# Patient Record
Sex: Female | Born: 1955 | Race: White | Hispanic: No | Marital: Married | Smoking: Never smoker
Health system: Southern US, Community
[De-identification: ages and names within clinical notes are randomized; demographics above are authoritative.]

## PROBLEM LIST (undated history)

## (undated) DIAGNOSIS — K5792 Diverticulitis of intestine, part unspecified, without perforation or abscess without bleeding: Secondary | ICD-10-CM

## (undated) DIAGNOSIS — K589 Irritable bowel syndrome without diarrhea: Secondary | ICD-10-CM

## (undated) DIAGNOSIS — N950 Postmenopausal bleeding: Secondary | ICD-10-CM

## (undated) DIAGNOSIS — K5790 Diverticulosis of intestine, part unspecified, without perforation or abscess without bleeding: Secondary | ICD-10-CM

## (undated) DIAGNOSIS — Z8719 Personal history of other diseases of the digestive system: Secondary | ICD-10-CM

## (undated) DIAGNOSIS — M5030 Other cervical disc degeneration, unspecified cervical region: Secondary | ICD-10-CM

## (undated) DIAGNOSIS — E042 Nontoxic multinodular goiter: Secondary | ICD-10-CM

## (undated) DIAGNOSIS — J189 Pneumonia, unspecified organism: Secondary | ICD-10-CM

## (undated) DIAGNOSIS — K219 Gastro-esophageal reflux disease without esophagitis: Secondary | ICD-10-CM

## (undated) DIAGNOSIS — K602 Anal fissure, unspecified: Secondary | ICD-10-CM

## (undated) DIAGNOSIS — C44101 Unspecified malignant neoplasm of skin of unspecified eyelid, including canthus: Secondary | ICD-10-CM

## (undated) HISTORY — DX: Pneumonia, unspecified organism: J18.9

## (undated) HISTORY — DX: Irritable bowel syndrome, unspecified: K58.9

## (undated) HISTORY — DX: Unspecified malignant neoplasm of skin of unspecified eyelid, including canthus: C44.101

## (undated) HISTORY — DX: Diverticulosis of intestine, part unspecified, without perforation or abscess without bleeding: K57.90

## (undated) HISTORY — DX: Diverticulitis of intestine, part unspecified, without perforation or abscess without bleeding: K57.92

## (undated) HISTORY — PX: DILATION AND CURETTAGE OF UTERUS: SHX78

## (undated) HISTORY — PX: OTHER SURGICAL HISTORY: SHX169

## (undated) HISTORY — PX: BREAST ENHANCEMENT SURGERY: SHX7

## (undated) HISTORY — PX: WISDOM TOOTH EXTRACTION: SHX21

## (undated) HISTORY — DX: Anal fissure, unspecified: K60.2

## (undated) HISTORY — PX: TUBAL LIGATION: SHX77

## (undated) NOTE — Progress Notes (Signed)
 Formatting of this note is different from the original. Subjective:    Patient ID: Jade [name] is a 6 y.o. female.  Chief complaint:  No chief complaint on file.   HPI This woman has been widowed for several years.  Over the last year she has become engaged and resume sexual activity.  She has had a couple of urinary tract infections.  She had a distant bilateral bladder breast implants which have been ruptured for several years.  She has been planning on having these replaced but it keeps putting it off.  She has had some lower abdominal pain.  She has had a couple of urinary tract infections.  She is due a colonoscopy.  She has had a Pap smear elsewhere  Social History:   Social History[1]   Family History:   Family History[2]   Current Medications[3] See present illness.  Review of Systems  Constitutional:  Positive for fatigue.  Cardiovascular:  Positive for chest pain.  All other systems reviewed and are negative.    Objective:   BP 120/80 (BP Location: Left arm, Patient Position: Sitting)   Pulse 62   Ht 1.727 m (5' 8)   Wt 66 kg (145 lb 6.4 oz)   SpO2 98%   BMI 22.11 kg/m  Physical Exam  GENGENERAL:     General Appearance: well-developed, well nourished female.  SKIN:     General: warm and dry without lesions   HEENT:    Nose: normal pink mucosa, septum is clear. Ears: external auditory canals clear, tympanic membrane(s) intact, no discharge, no skin lesions, no masses, hearing is within normal limits.       Mouth: no oral pathological findings. Throat: clear. Lips: symmetrical.    EYES:    Conjunctiva: normal.     Eyelids: normal.     Fundi: flat discs.     Pupils: equal, round, reactive to light with extraocular movements intact.   NECK:    General: supple, no jugular venous distention, no hepatojugular reflux, no thyromegaly, no goiter   LYMPH NODES:    Axillary: no adenopathy.     Cervical: no adenopathy.     Inguinal: no adenopathy.    BREASTS:    Bilateral ruptured implants. LUNGS:    Respiratory distress: none.     Auscultation: clear.     Percussion: clear.   HEART:    PMI: normal.     Rate: regular.     Edema: no dependent edema.     Gallop: none.     Rub: none.     Carotid bruit: none.     Abdominal aortic bruit: none.     Femoral arteries: no bruit, equal amplitude.     Pulses: peripheral, dorsalis pedis, posterior pulses are 2+.     Rhythm: regular.     Murmurs: none.   ABDOMEN:    General: soft and nontender.     Organomegaly: none.     Masses: none.     Hernia: none.   MUSCULOSKELETAL:     Gait: normal.     Digits/nails: no cyanosis, no clubbing.     Head/neck: normal range of motion.             Spine: normal ROM.     Ribs: nontender.     Pelvis: normal range of motion.     Upper extremities bilaterally: well perfused.     Lower extremities bilaterally: well perfused.  NEUROLOGICAL:    Cranial nerves: II-XII are intact.  Sensory: no gross deficits.     Reflexes: deep tendons reflexes are 2+ and symmetrical, no pathological reflexes.   PSYCHIATRIC:     Judgement and insight: WNL.     Oriented: X three.     Memory: within normal limits.     Depression: none.     Anxiety: none.     Agitation: none.   Electronically signed by: ONEIL KANDICE SPLINTER, MD  This visit was generated using voice recognition software, certain errors may have occurred in transcription.     Assessment and Plan:   1. Encounter for routine adult health examination with abnormal findings     2. Lower abdominal pain  CT Abdomen Pelvis W Wo Contrast   3. Postmenopausal  DXA bone density 1 + sites   4. Colon cancer screening  Colonoscopy Referral   5. Pelvic pain  Ultrasound non-ob transvaginal   6. Chest discomfort  X-ray chest 2 views   7. Primary hypertension     8. 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 Dense breast     10. Rupture of implant of left breast, subsequent encounter      Her  blood pressure control is good.  Her chemistries are reviewed and found to be acceptable.  She is due a bone density and a chest x-ray.  She has had an MRI of her breasts earlier this year.  With her abdominal symptoms I would like to do a CT of the abdomen and pelvis with and without oral and IV contrast.  I would like to do a vaginal pelvic ultrasound.  She is leaving the state and will get this done on the [facility].  Will have a video visit afterwards.  I have congratulated her on getting engaged.  She is on hormone replacement therapy including Prempro and testosterone.      I approve my staff updating/discontinuing the medications in my medical record.   Electronically signed by: ONEIL KANDICE SPLINTER, MD  This visit was generated using voice recognition software, certain errors may have occurred in transcription.      [1]  Social History Socioeconomic History   Marital status: Widowed  Tobacco Use   Smoking status: Never    Passive exposure: Never   Smokeless tobacco: Never  Vaping Use   Vaping status: Never Used  Substance and Sexual Activity   Alcohol use: Yes    Alcohol/week: 1.0 standard drink of alcohol    Types: 1 Standard drinks or equivalent per week    Comment: not often   Drug use: Never   Sexual activity: Not Currently  [2]  Family History Problem Relation Name Age of Onset   Stroke Mother     Lung cancer Father    [3]  Current Outpatient Medications  Medication Sig Dispense Refill   estrogen, conjugated,-medroxyprogesterone (Prempro) 0.625-2.5 MG tablet Take 1 tablet by mouth daily 28 tablet 3   tretinoin (Retin-A) 0.05 % cream Apply a small amount to skin every night at bedtime     UNABLE TO FIND Testosterone 8mg /ml Cream Apply 0.5ML (TWO CLICKS) to think skin daily 45 g 1   valACYclovir (Valtrex) 1000 MG tablet Take 2 tablet by mouth twice a day at first sign of symptoms. For fever blisters.     valsartan (Diovan) 160 MG tablet Take 1 tablet (160 mg total)  by mouth daily 90 tablet 1   ibuprofen (ADVIL) 200 MG tablet Take 200 mg by mouth     nitrofurantoin (  Macrodantin) 100 MG capsule      Current Facility-Administered Medications  Medication Dose Route Frequency Provider Last Rate Last Admin   lidocaine  (Xylocaine ) 1 % injection 0.9-4.2 mL  0.9-4.2 mL Other PRN Lorie Anes, MD       Electronically signed by Lorie Anes, MD at 06/23/2024  9:08 AM CDT

## (undated) NOTE — Addendum Note (Signed)
 Addended by: TANKSLEY, JORDAN on: 06/28/2024 02:32 PM   Modules accepted: Orders   Electronically signed by Tanks[place], [place], CCMA at 06/28/2024  2:32 PM CDT

---

## 2006-01-29 ENCOUNTER — Other Ambulatory Visit: Admission: RE | Admit: 2006-01-29 | Discharge: 2006-01-29 | Payer: Self-pay | Admitting: Obstetrics and Gynecology

## 2007-10-21 HISTORY — PX: OTHER SURGICAL HISTORY: SHX169

## 2008-07-17 ENCOUNTER — Inpatient Hospital Stay (HOSPITAL_COMMUNITY): Admission: AD | Admit: 2008-07-17 | Discharge: 2008-07-17 | Payer: Self-pay | Admitting: Obstetrics & Gynecology

## 2008-10-20 HISTORY — PX: ORBITAL RIM RECONSTRUCTION: SHX427

## 2009-10-20 HISTORY — PX: COLONOSCOPY: SHX174

## 2010-01-22 ENCOUNTER — Ambulatory Visit (HOSPITAL_COMMUNITY): Admission: RE | Admit: 2010-01-22 | Discharge: 2010-01-22 | Payer: Self-pay | Admitting: Obstetrics and Gynecology

## 2010-08-06 ENCOUNTER — Emergency Department (HOSPITAL_COMMUNITY): Admission: EM | Admit: 2010-08-06 | Discharge: 2010-08-06 | Payer: Self-pay | Admitting: Emergency Medicine

## 2010-08-07 ENCOUNTER — Emergency Department (HOSPITAL_COMMUNITY): Admission: EM | Admit: 2010-08-07 | Discharge: 2010-08-07 | Payer: Self-pay | Admitting: Emergency Medicine

## 2010-08-20 DEATH — deceased

## 2010-09-05 ENCOUNTER — Encounter: Payer: Self-pay | Admitting: Internal Medicine

## 2010-09-10 ENCOUNTER — Ambulatory Visit: Payer: Self-pay | Admitting: Internal Medicine

## 2010-09-10 DIAGNOSIS — R0789 Other chest pain: Secondary | ICD-10-CM

## 2010-09-10 DIAGNOSIS — R079 Chest pain, unspecified: Secondary | ICD-10-CM | POA: Insufficient documentation

## 2010-09-16 ENCOUNTER — Ambulatory Visit (HOSPITAL_COMMUNITY)
Admission: RE | Admit: 2010-09-16 | Discharge: 2010-09-16 | Payer: Self-pay | Source: Home / Self Care | Admitting: Internal Medicine

## 2010-09-20 ENCOUNTER — Telehealth: Payer: Self-pay | Admitting: Internal Medicine

## 2010-09-24 ENCOUNTER — Encounter: Payer: Self-pay | Admitting: Gastroenterology

## 2010-09-27 ENCOUNTER — Telehealth: Payer: Self-pay | Admitting: Internal Medicine

## 2010-10-16 ENCOUNTER — Ambulatory Visit: Payer: Self-pay | Admitting: Internal Medicine

## 2010-11-05 ENCOUNTER — Encounter: Payer: Self-pay | Admitting: Gastroenterology

## 2010-11-05 ENCOUNTER — Ambulatory Visit: Admission: RE | Admit: 2010-11-05 | Payer: Self-pay | Source: Home / Self Care | Admitting: Gastroenterology

## 2010-11-14 ENCOUNTER — Other Ambulatory Visit: Payer: Self-pay | Admitting: Gastroenterology

## 2010-11-19 NOTE — Assessment & Plan Note (Signed)
Summary: np6/appt at 12:30-mb   Referring Provider:  Micah Flesher   History of Present Illness: Jade [name] is a 55 y/o postmenopausal woman with no significant PMHx excpt for h/o uterine fibroids and ovarian cysts referred by Dr. Micah Flesher for further evaluation of jaw and throat pain.  Denies any h/o known cardiac problems. No h/o HTN or HL. 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 + strong familh h/o CAD/stroke  In early October was sitting on couch and had sharp severe CP. Last 1-2 mins. Recuured about a week later while driving the car. On October 18 was sitting at computer had jaw pain in neck into lower jaw. No CP. Became anxious and diaphorectic. Went to ER. Had ECG but left before enzymes drawn. Went back the next day to ge enzymes and they were normal.   Since that time has had constant pressure in chest going to back. Had another episode of jaw pain last week which was transient. Very concerned about it. Has given up all her walking. No relief with iburpofen, ASA or tylenol.  No change with eating. No reflux symptoms or esophageal. No blood in stool. No long travel. Had labs yesterday with normal electrolytes and amylase.   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   Vital Signs:  Patient profile:   55 year old female Height:      68 inches Weight:      145 pounds BMI:     22.13 Pulse rate:   62 / minute Resp:     16 per minute BP sitting:   110 / 66  (right arm)  Vitals Entered By: Marrion Coy, CNA (11-Sep-2010 12:42 PM)  Physical Exam  General:  Well appearing. no resp difficulty HEENT: normal. minimal asymmetry of L lower lip Neck: supple. no JVD. Carotids 2+ bilat; no bruits. No lymphadenopathy or thryomegaly appreciated. Cor: PMI nondisplaced. Regular rate & rhythm. No rubs, gallops, murmur. Lungs: clear Abdomen: soft, minimally tender in epigastrum, nondistended. No hepatosplenomegaly. No bruits or masses. Good bowel sounds. Extremities: no cyanosis, clubbing, rash, edema Neuro: alert & orientedx3, cranial nerves grossly intact. moves all 4 extremities w/o difficulty. affect pleasant    Impression & Recommendations:  Problem # 1:  CHEST TIGHTNESS-PRESSURE-OTHER (ICD-786.59) Typical and atypical features. I suspect this is "silent" reflux but given her family history of CV disease will need further risk stratification. She is concerned about false negative rate of routine stress testing so we had long discussion re; cath vs cardiac CT. Not a candiate for Promise trial due to lack of CV RFs. She has opted to pay for cardiac CT out of  pocket. Will try to arrange for Friday. Start Protonix 40.   Patient Instructions: 1)  We are going to try to get your cardiac CT scheduled for Friday 11/25. We will call you today with the details of this. 2)  Start Protonix 40mg  once daily. 3)  Your physician recommends that you schedule a follow-up appointment in: 1 month.  Appended Document: np6    Clinical Lists Changes  Medications: Rx of PROTONIX 40 MG TBEC  (PANTOPRAZOLE SODIUM) take one tab by mouth once daily;  #30 x 6;  Signed;  Entered by: Sherri Rad, RN, BSN;  Authorized by: Dolores Patty, MD, [facility];  Method used: [facility]  [place] [address] [place], [place], [place]  [address], Ph: [phone] or [phone], Fax: [phone] Orders: Added new Referral order of Cardiac CTA (Cardiac CTA) - Signed    Prescriptions: PROTONIX 40 MG TBEC (PANTOPRAZOLE SODIUM) take one tab by mouth once daily  #30 x 6   Entered by:   Sherri Rad, RN, BSN   Authorized by:   Dolores Patty, MD, [facility]   Signed by:   Sherri Rad, RN, BSN on 09/10/2010   Method used:   [facility]ically to        [facility]  [place] [address] (retail)       [address]       [place], [place]  [address]       Ph: [phone] or [phone]       Fax: [phone]   RxID:   [id number]

## 2010-11-19 NOTE — Letter (Signed)
Summary: New Patient letter  [facility]  [address] [place], [place] [address]   Phone: [phone]  Fax: [phone]       09/24/2010 MRN: [id number]  [facility] [address] [place], [place]  [address]  Dear Jade [name],  Welcome to the Gastroenterology Division at [facility]    You are scheduled to see Dr.  Jarold Motto on 11-05-2010 at 1:30pm on the 3rd floor at [facility], [address]  We ask that you try to arrive at our office 15 minutes prior to your appointment time to allow for check-in.  We would like you to complete the enclosed self-administered evaluation form prior to your visit and bring it with you on the day of your appointment.  We will review it with you.  Also, please bring a complete list of all your medications or, if you prefer, bring the medication bottles and we will list them.  Please bring your insurance card so that we may make a copy of it.  If your insurance requires a referral to see a specialist, please bring your referral form from your primary care physician.  Co-payments are due at the time of your visit and may be paid by cash, check or credit card.     Your office visit will consist of a consult with your physician (includes a physical exam), any laboratory testing he/she may order, scheduling of any necessary diagnostic testing (e.g. x-ray, ultrasound, CT-scan), and scheduling of a procedure (e.g. Endoscopy, Colonoscopy) if required.  Please allow enough time on your schedule to allow for any/all of these possibilities.    If you cannot keep your appointment, please call [phone] to cancel or reschedule prior to your appointment date.  This allows [place] the opportunity to schedule an appointment for another patient in need of care.  If you do not cancel or reschedule by 5 p.m. the business day prior to your appointment date, you will be charged a $50.00 late cancellation/no-show fee.    Thank you for choosing  [facility] for your medical needs.  We appreciate the opportunity to care for you.  Please visit [place] at our website  to learn more about our practice.                     Sincerely,                                                             The Gastroenterology Division

## 2010-11-19 NOTE — Progress Notes (Signed)
Summary: question on ct procedure  Phone Note Call from Patient Call back at Home Phone [phone] Call back at cell- [phone]   Caller: Patient Reason for Call: Talk to Nurse Summary of Call: pt has question re ct procedure she had done. pt states has a rash on her arm/ pulse rate is up/pt has question on meds.  Initial call taken by: Roe Coombs,  September 20, 2010 12:24 PM  Follow-up for Phone Call        spoke w/pt she states she developed a rash on her arm on Wed she feels it could be from the betadine that put on her arm for IV, she states she has been putting lotion on it and it hasn't gotten better but it hasn't gotten worse either, advised that is likely recommended she take benadryl and use some corizone cream, she is agreeable.  She states she feels the Protonix was causing her heart to race and at times she could feel it pounding, she states she checked it at [employer] today and pulse was 70 which is high for her, she states she doesn't want to take med so she has stopped, she states it hasn't helped her symptoms and she would like referral to GI will see who Dr Gala Romney recommends and place referral Meredith Staggers, RN  September 20, 2010 4:41 PM   Additional Follow-up for Phone Call Additional follow up Details #1::        next avaialble. Dolores Patty, MD, [facility]  September 20, 2010 5:42 PM      Appended Document: question on ct procedure order for GI referral placed Meredith Staggers, RN  September 24, 2010 10:16 AM    Clinical Lists Changes  Orders: Added new Referral order of Gastroenterology Referral (GI) - Signed

## 2010-11-19 NOTE — Progress Notes (Signed)
Summary: heart beatr/heart rate  Phone Note Call from Patient Call back at Home Phone [phone]   Caller: Patient Reason for Call: Talk to Nurse Summary of Call: Pt states she can hear her heart beat in her ears. Heart rate 86 pt states she is normally in the 50s. Initial call taken by: Roe Coombs,  September 27, 2010 11:07 AM  Follow-up for Phone Call        I called and spoke with the pt. She states she can hear her heart beating in her ears since she had her CT scan done. She states her HR is always in the 50's, but today she checked it and it was 86 bpm. She is concerned that there may be some relation to this and the CT scan or the access they had to get on her. I explained to the pt I would need to review with Dr. Gala Romney. She is agreeable. Follow-up by: Sherri Rad, RN, BSN,  September 27, 2010 12:01 PM  Additional Follow-up for Phone Call Additional follow up Details #1::        I spoke with Dr. Gala Romney. He states there should be no relationship between her HR and the pounding in her ears with the CT scan or access. He did state she could come for an EKG if she were concerned about this. I called the pt and explained this to her. She states she will just observe her symptoms for now and she will call us back should she feel worse. Additional Follow-up by: Sherri Rad, RN, BSN,  September 27, 2010 12:10 PM

## 2010-11-20 ENCOUNTER — Ambulatory Visit
Admission: RE | Admit: 2010-11-20 | Discharge: 2010-11-20 | Disposition: A | Discharge status: Home / Self Care | Payer: 59 | Source: Ambulatory Visit | Attending: Gastroenterology | Admitting: Gastroenterology

## 2010-11-21 NOTE — Assessment & Plan Note (Signed)
Summary: GERD & REFLUX/YF  History of Present Illness:   patient not seen because she apparently is a patient of Dr. Ewing Schlein.           Allergies: No Known Drug Allergies

## 2010-11-27 ENCOUNTER — Other Ambulatory Visit: Payer: Self-pay | Admitting: Otolaryngology

## 2010-11-27 DIAGNOSIS — M542 Cervicalgia: Secondary | ICD-10-CM

## 2010-11-28 ENCOUNTER — Ambulatory Visit
Admission: RE | Admit: 2010-11-28 | Discharge: 2010-11-28 | Disposition: A | Discharge status: Home / Self Care | Payer: 59 | Source: Ambulatory Visit | Attending: Otolaryngology | Admitting: Otolaryngology

## 2010-11-28 ENCOUNTER — Other Ambulatory Visit: Payer: 59

## 2010-11-28 DIAGNOSIS — M542 Cervicalgia: Secondary | ICD-10-CM

## 2011-01-01 LAB — CBC
HCT: 40.3 % (ref 36.0–46.0)
Hemoglobin: 13.2 g/dL (ref 12.0–15.0)
MCH: 29.8 pg (ref 26.0–34.0)
RBC: 4.43 MIL/uL (ref 3.87–5.11)

## 2011-01-01 LAB — POCT CARDIAC MARKERS
CKMB, poc: <1 ng/mL — ABNORMAL LOW (ref 1.0–8.0)
Troponin i, poc: <0.05 ng/mL (ref 0.00–0.09)

## 2011-01-01 LAB — POCT I-STAT, CHEM 8
Chloride: 104 mEq/L (ref 96–112)
Creatinine, Ser: 1.3 mg/dL — ABNORMAL HIGH (ref 0.4–1.2)
Glucose, Bld: 106 mg/dL — ABNORMAL HIGH (ref 70–99)
Hemoglobin: 14.3 g/dL (ref 12.0–15.0)
Potassium: 3.7 mEq/L (ref 3.5–5.1)

## 2011-01-01 LAB — DIFFERENTIAL
Eosinophils Absolute: 0.1 10*3/uL (ref 0.0–0.7)
Lymphs Abs: 1.5 10*3/uL (ref 0.7–4.0)
Monocytes Absolute: 0.3 10*3/uL (ref 0.1–1.0)
Monocytes Relative: 6 % (ref 3–12)
Neutrophils Relative %: 64 % (ref 43–77)

## 2011-04-22 ENCOUNTER — Encounter: Payer: Self-pay | Admitting: Internal Medicine

## 2011-07-21 LAB — POCT PREGNANCY, URINE: Preg Test, Ur: NEGATIVE

## 2011-07-21 LAB — COMPREHENSIVE METABOLIC PANEL
AST: 16
Albumin: 3.8
Alkaline Phosphatase: 76
Chloride: 104
Creatinine, Ser: 0.66
GFR calc Af Amer: >60
Potassium: 4
Total Bilirubin: 0.9

## 2011-07-21 LAB — URINALYSIS, ROUTINE W REFLEX MICROSCOPIC
Nitrite: NEGATIVE
Specific Gravity, Urine: 1.02
Urobilinogen, UA: 0.2

## 2011-07-21 LAB — WET PREP, GENITAL: Yeast Wet Prep HPF POC: NONE SEEN

## 2011-07-21 LAB — CBC
Platelets: 172
WBC: 7.3

## 2014-06-08 ENCOUNTER — Encounter (HOSPITAL_BASED_OUTPATIENT_CLINIC_OR_DEPARTMENT_OTHER): Payer: Self-pay | Admitting: *Deleted

## 2014-06-09 ENCOUNTER — Encounter (HOSPITAL_BASED_OUTPATIENT_CLINIC_OR_DEPARTMENT_OTHER): Payer: Self-pay | Admitting: *Deleted

## 2014-06-09 DIAGNOSIS — N950 Postmenopausal bleeding: Secondary | ICD-10-CM | POA: Diagnosis present

## 2014-06-09 DIAGNOSIS — K219 Gastro-esophageal reflux disease without esophagitis: Secondary | ICD-10-CM | POA: Diagnosis not present

## 2014-06-09 DIAGNOSIS — E041 Nontoxic single thyroid nodule: Secondary | ICD-10-CM | POA: Diagnosis not present

## 2014-06-09 DIAGNOSIS — M5030 Other cervical disc degeneration, unspecified cervical region: Secondary | ICD-10-CM | POA: Diagnosis not present

## 2014-06-09 DIAGNOSIS — N840 Polyp of corpus uteri: Secondary | ICD-10-CM | POA: Diagnosis not present

## 2014-06-09 DIAGNOSIS — R9389 Abnormal findings on diagnostic imaging of other specified body structures: Secondary | ICD-10-CM | POA: Diagnosis not present

## 2014-06-09 DIAGNOSIS — D259 Leiomyoma of uterus, unspecified: Secondary | ICD-10-CM | POA: Diagnosis not present

## 2014-06-09 LAB — CBC
HCT: 35.3 % — ABNORMAL LOW (ref 36.0–46.0)
Hemoglobin: 12.2 g/dL (ref 12.0–15.0)
MCH: 31 pg (ref 26.0–34.0)
MCHC: 34.6 g/dL (ref 30.0–36.0)
MCV: 89.8 fL (ref 78.0–100.0)
Platelets: 203 10*3/uL (ref 150–400)
RBC: 3.93 MIL/uL (ref 3.87–5.11)
RDW: 11.9 % (ref 11.5–15.5)
WBC: 4.9 10*3/uL (ref 4.0–10.5)

## 2014-06-09 NOTE — Progress Notes (Signed)
NPO AFTER MN. ARRIVE AT 0615. CBC TO BE DONE TODAY. WILL TAKE DEXILANT AM DOS W/ SIPS OF WATER.

## 2014-06-09 NOTE — H&P (Signed)
Jade [name] is an 58 y.o. female with postmenopausal bleeding on Prempro. Endometrial stripe 7 mm  Pertinent Gynecological History: Menses: post-menopausal Bleeding: post menopausal bleeding Contraception: none DES exposure: denies Blood transfusions: none Sexually transmitted diseases: no past history Previous GYN Procedures: none  Last mammogram: normal Date: 2015 Last pap: normal Date: 2015 OB History: G0, P0  Menstrual History: Menarche age: unknown  No LMP recorded. Patient is postmenopausal.    Past Medical History  Diagnosis Date  . Osteoarthritis   . Endometrial polyp   . Multiple thyroid nodules     BILATERAL PER CT  . DDD (degenerative disc disease), cervical     Past Surgical History  Procedure Laterality Date  . Breast enhancement surgery    . Ovarian cyst surgery      Uterine fibroid surgery  . Orbital rim reconstruction      Eyelid CA  . Tubal ligation      Family History  Problem Relation Age of Onset  . Stroke Mother     Many people on mother's side have had strokes  . Lung cancer Father   . Heart attack Father     Social History:  has no tobacco, alcohol, and drug history on file.  Allergies: Allergies not on file  No prescriptions prior to admission    ROS  There were no vitals taken for this visit. Physical Exam  General alert and oriented Lung CTAB Car RRR Abdomen is soft and nontender Pelvic Above  Assessment/Plan: Postmenopausal bleeding Thickened endometrial stripe D and C, Hysteroscopy Risks reviewed Jade [name] L 06/09/2014, 8:27 AM

## 2014-06-12 ENCOUNTER — Encounter (HOSPITAL_BASED_OUTPATIENT_CLINIC_OR_DEPARTMENT_OTHER): Payer: 59 | Admitting: Anesthesiology

## 2014-06-12 ENCOUNTER — Ambulatory Visit (HOSPITAL_BASED_OUTPATIENT_CLINIC_OR_DEPARTMENT_OTHER)
Admission: RE | Admit: 2014-06-12 | Discharge: 2014-06-12 | Disposition: A | Discharge status: Home / Self Care | Payer: 59 | Source: Ambulatory Visit | Attending: Obstetrics and Gynecology | Admitting: Obstetrics and Gynecology

## 2014-06-12 ENCOUNTER — Ambulatory Visit (HOSPITAL_BASED_OUTPATIENT_CLINIC_OR_DEPARTMENT_OTHER): Payer: 59 | Admitting: Anesthesiology

## 2014-06-12 ENCOUNTER — Encounter (HOSPITAL_BASED_OUTPATIENT_CLINIC_OR_DEPARTMENT_OTHER): Payer: Self-pay | Admitting: *Deleted

## 2014-06-12 ENCOUNTER — Encounter (HOSPITAL_BASED_OUTPATIENT_CLINIC_OR_DEPARTMENT_OTHER)
Admission: RE | Disposition: A | Discharge status: Home / Self Care | Payer: Self-pay | Source: Ambulatory Visit | Attending: Obstetrics and Gynecology

## 2014-06-12 DIAGNOSIS — K219 Gastro-esophageal reflux disease without esophagitis: Secondary | ICD-10-CM | POA: Insufficient documentation

## 2014-06-12 DIAGNOSIS — D259 Leiomyoma of uterus, unspecified: Secondary | ICD-10-CM | POA: Insufficient documentation

## 2014-06-12 DIAGNOSIS — N950 Postmenopausal bleeding: Secondary | ICD-10-CM | POA: Diagnosis not present

## 2014-06-12 DIAGNOSIS — E041 Nontoxic single thyroid nodule: Secondary | ICD-10-CM | POA: Insufficient documentation

## 2014-06-12 DIAGNOSIS — N840 Polyp of corpus uteri: Secondary | ICD-10-CM | POA: Insufficient documentation

## 2014-06-12 DIAGNOSIS — M5030 Other cervical disc degeneration, unspecified cervical region: Secondary | ICD-10-CM | POA: Insufficient documentation

## 2014-06-12 DIAGNOSIS — R9389 Abnormal findings on diagnostic imaging of other specified body structures: Secondary | ICD-10-CM | POA: Insufficient documentation

## 2014-06-12 HISTORY — DX: Other cervical disc degeneration, unspecified cervical region: M50.30

## 2014-06-12 HISTORY — DX: Postmenopausal bleeding: N95.0

## 2014-06-12 HISTORY — DX: Nontoxic multinodular goiter: E04.2

## 2014-06-12 HISTORY — DX: Personal history of other diseases of the digestive system: Z87.19

## 2014-06-12 HISTORY — DX: Gastro-esophageal reflux disease without esophagitis: K21.9

## 2014-06-12 HISTORY — PX: HYSTEROSCOPY WITH D & C: SHX1775

## 2014-06-12 SURGERY — DILATATION AND CURETTAGE /HYSTEROSCOPY
Anesthesia: General | Site: Uterus

## 2014-06-12 MED ORDER — GLYCOPYRROLATE 0.2 MG/ML IJ SOLN
INTRAMUSCULAR | Status: DC | PRN
  Administered 2014-06-12: 0.2 mg via INTRAVENOUS

## 2014-06-12 MED ORDER — LACTATED RINGERS IV SOLN
INTRAVENOUS | Status: DC
Start: 2014-06-12 — End: 2014-06-12
  Administered 2014-06-12: 07:00:00 via INTRAVENOUS
  Filled 2014-06-12: qty 1000

## 2014-06-12 MED ORDER — DEXAMETHASONE SODIUM PHOSPHATE 4 MG/ML IJ SOLN
INTRAMUSCULAR | Status: DC | PRN
  Administered 2014-06-12: 8 mg via INTRAVENOUS

## 2014-06-12 MED ORDER — FENTANYL CITRATE 0.05 MG/ML IJ SOLN
INTRAMUSCULAR | Status: DC | PRN
  Administered 2014-06-12: 100 ug via INTRAVENOUS

## 2014-06-12 MED ORDER — PROPOFOL 10 MG/ML IV BOLUS
INTRAVENOUS | Status: DC | PRN
  Administered 2014-06-12: 150 mg via INTRAVENOUS

## 2014-06-12 MED ORDER — LACTATED RINGERS IV SOLN
INTRAVENOUS | Status: DC
  Filled 2014-06-12: qty 1000

## 2014-06-12 MED ORDER — METOCLOPRAMIDE HCL 5 MG/ML IJ SOLN
10.0000 mg | Freq: Once | INTRAMUSCULAR | Status: DC | PRN
  Filled 2014-06-12: qty 2

## 2014-06-12 MED ORDER — LIDOCAINE HCL (CARDIAC) 20 MG/ML IV SOLN
INTRAVENOUS | Status: DC | PRN
  Administered 2014-06-12: 70 mg via INTRAVENOUS

## 2014-06-12 MED ORDER — FENTANYL CITRATE 0.05 MG/ML IJ SOLN
INTRAMUSCULAR | Status: AC
  Filled 2014-06-12: qty 4

## 2014-06-12 MED ORDER — LIDOCAINE HCL 1 % IJ SOLN
INTRAMUSCULAR | Status: DC | PRN
  Administered 2014-06-12: 10 mL

## 2014-06-12 MED ORDER — MIDAZOLAM HCL 2 MG/2ML IJ SOLN
INTRAMUSCULAR | Status: AC
  Filled 2014-06-12: qty 2

## 2014-06-12 MED ORDER — STERILE WATER FOR IRRIGATION IR SOLN
Status: DC | PRN
  Administered 2014-06-12: 1000 mL

## 2014-06-12 MED ORDER — MIDAZOLAM HCL 5 MG/5ML IJ SOLN
INTRAMUSCULAR | Status: DC | PRN
  Administered 2014-06-12: 2 mg via INTRAVENOUS

## 2014-06-12 MED ORDER — GLYCINE 1.5 % IR SOLN
Status: DC | PRN
  Administered 2014-06-12: 3000 mL

## 2014-06-12 MED ORDER — ONDANSETRON HCL 4 MG/2ML IJ SOLN
INTRAMUSCULAR | Status: DC | PRN
  Administered 2014-06-12: 4 mg via INTRAVENOUS

## 2014-06-12 MED ORDER — CEFAZOLIN SODIUM-DEXTROSE 2-3 GM-% IV SOLR
2.0000 g | INTRAVENOUS | Status: AC
  Administered 2014-06-12: 2 g via INTRAVENOUS
  Filled 2014-06-12: qty 50

## 2014-06-12 MED ORDER — FENTANYL CITRATE 0.05 MG/ML IJ SOLN
25.0000 ug | INTRAMUSCULAR | Status: DC | PRN
  Filled 2014-06-12: qty 1

## 2014-06-12 MED ORDER — ACETAMINOPHEN 10 MG/ML IV SOLN
INTRAVENOUS | Status: DC | PRN
  Administered 2014-06-12: 1000 mg via INTRAVENOUS

## 2014-06-12 MED ORDER — CEFAZOLIN SODIUM-DEXTROSE 2-3 GM-% IV SOLR
INTRAVENOUS | Status: AC
  Filled 2014-06-12: qty 50

## 2014-06-12 MED ORDER — MEPERIDINE HCL 25 MG/ML IJ SOLN
6.2500 mg | INTRAMUSCULAR | Status: DC | PRN
  Filled 2014-06-12: qty 1

## 2014-06-12 SURGICAL SUPPLY — 29 items
CANISTER SUCTION 2500CC (MISCELLANEOUS) ×2 IMPLANT
CATH ROBINSON RED A/P 16FR (CATHETERS) ×2 IMPLANT
CLOTH BEACON ORANGE TIMEOUT ST (SAFETY) ×2 IMPLANT
COVER TABLE BACK 60X90 (DRAPES) ×2 IMPLANT
DRAPE CAMERA CLOSED 9X96 (DRAPES) ×2 IMPLANT
DRAPE HYSTEROSCOPY (DRAPE) ×2 IMPLANT
DRAPE LG THREE QUARTER DISP (DRAPES) ×2 IMPLANT
DRESSING TELFA 8X3 (GAUZE/BANDAGES/DRESSINGS) ×2 IMPLANT
ELECT REM PT RETURN 9FT ADLT (ELECTROSURGICAL)
ELECTRODE REM PT RTRN 9FT ADLT (ELECTROSURGICAL) IMPLANT
GLOVE BIO SURGEON STRL SZ 6.5 (GLOVE) ×4 IMPLANT
GLOVE BIOGEL M STRL SZ7.5 (GLOVE) ×2 IMPLANT
GLOVE BIOGEL PI IND STRL 7.0 (GLOVE) ×1 IMPLANT
GLOVE BIOGEL PI INDICATOR 7.0 (GLOVE) ×1
GLOVE SURG SS PI 7.5 STRL IVOR (GLOVE) ×2 IMPLANT
GLYCINE 1.5% IRRIG UROMATIC (IV SOLUTION) ×2 IMPLANT
GOWN PREVENTION PLUS LG XLONG (DISPOSABLE) IMPLANT
GOWN STRL REIN XL XLG (GOWN DISPOSABLE) IMPLANT
GOWN STRL REUS W/TWL LRG LVL3 (GOWN DISPOSABLE) ×2 IMPLANT
GOWN STRL REUS W/TWL XL LVL3 (GOWN DISPOSABLE) ×2 IMPLANT
LEGGING LITHOTOMY PAIR STRL (DRAPES) ×2 IMPLANT
LOOP ANGLED CUTTING 22FR (CUTTING LOOP) IMPLANT
NEEDLE SPNL 22GX3.5 QUINCKE BK (NEEDLE) ×2 IMPLANT
SET TUBING HYSTEROSCOPY 2 NDL (TUBING) ×2 IMPLANT
SYR CONTROL 10ML LL (SYRINGE) ×2 IMPLANT
TOWEL OR 17X24 6PK STRL BLUE (TOWEL DISPOSABLE) ×4 IMPLANT
TRAY DSU PREP LF (CUSTOM PROCEDURE TRAY) ×2 IMPLANT
TUBE HYSTEROSCOPY W Y-CONNECT (TUBING) ×2 IMPLANT
WATER STERILE IRR 500ML POUR (IV SOLUTION) ×2 IMPLANT

## 2014-06-12 NOTE — Anesthesia Procedure Notes (Signed)
Procedure Name: LMA Insertion Date/Time: 06/12/2014 7:39 AM Performed by: Wanita Chamberlain Pre-anesthesia Checklist: Patient identified, Timeout performed, Emergency Drugs available, Suction available and Patient being monitored Patient Re-evaluated:Patient Re-evaluated prior to inductionOxygen Delivery Method: Circle system utilized Preoxygenation: Pre-oxygenation with 100% oxygen Intubation Type: IV induction Ventilation: Mask ventilation without difficulty LMA: LMA inserted LMA Size: 4.0 Number of attempts: 1

## 2014-06-12 NOTE — Brief Op Note (Signed)
06/12/2014  8:01 AM  PATIENT:  [name]  58 y.o. female  PRE-OPERATIVE DIAGNOSIS:  Post menopausal bleeding, Thickened endometrium  POST-OPERATIVE DIAGNOSIS:  Same Endometrial Polyp Submucosal Fibroid PROCEDURE:  Procedure(s): DILATATION AND CURETTAGE /HYSTEROSCOPY (N/A) Resection of Endometrial Polyp and submucosal fibroid  SURGEON:  Surgeon(s) and Role:    * Cyril Mourning, MD - Primary  PHYSICIAN ASSISTANT:   ASSISTANTS: none   ANESTHESIA:   IV sedation and paracervical block  EBL:     BLOOD ADMINISTERED:none  DRAINS: none   LOCAL MEDICATIONS USED:  LIDOCAINE   SPECIMEN:  Source of Specimen:  submucosal fibroid, endometrial polyp and uterine currettings  DISPOSITION OF SPECIMEN:  PATHOLOGY  COUNTS:  YES  TOURNIQUET:  * No tourniquets in log *  DICTATION: .Other Dictation: Dictation Number [id number]  PLAN OF CARE: Discharge to home after PACU  PATIENT DISPOSITION:  PACU - hemodynamically stable.   Delay start of Pharmacological VTE agent (>24hrs) due to surgical blood loss or risk of bleeding: not applicable

## 2014-06-12 NOTE — Progress Notes (Signed)
H and P on the chart. No significant changes Consent signed

## 2014-06-12 NOTE — Op Note (Signed)
Jade [name], Jade [name]                 ACCOUNT NO.:  [id number]  MEDICAL RECORD NO.:  [id number]  LOCATION:                                 FACILITY:  PHYSICIAN:  Lemont Sitzmann L. Helane Rima, M.D.    DATE OF BIRTH:  DATE OF PROCEDURE:  06/12/2014 DATE OF DISCHARGE:                              OPERATIVE REPORT   PREOPERATIVE DIAGNOSIS:  Postmenopausal bleeding and thickened endometrium.  POSTOPERATIVE DIAGNOSIS:  Postmenopausal bleeding and thickened endometrium.  PROCEDURES:  D and C, hysteroscopy, and resection of submucosal fibroid and endometrial polyp.  SURGEON:  Reigna Ruperto L. Helane Rima, M.D.  ANESTHESIA:  Paracervical block with IV sedation.  COMPLICATIONS:  None.  DRAINS:  None.  EBL:  Minimal.  OPERATIVE PROCEDURE:  After the risks of the procedure were evaluated and discussed with the patient, the consent form was signed.  She was taken to the operating room.  She was prepped and draped in usual sterile fashion after anesthesia was administered.  Time-out was performed.  A speculum was inserted into the vagina.  The cervix was grasped with a tenaculum and a paracervical block was performed in a standard fashion.  The cervical internal os was dilated up to a #23 Pratt dilator.  The diagnostic hysteroscope was inserted into the uterine cavity with excellent visualization.  I could see both tubal ostia.  I could see there was submucosal fibroid and a small endometrial polyp behind the fibroid.  The hysteroscope was removed and a sharp curette was inserted.  The uterus was thoroughly curetted of all tissue. I then inserted the polyp forceps and I removed the fragments of the fibroid as well as the polyp.  I then reinserted the hysteroscope and the uterine cavity was completely free and clean.  There was no polyp or fibroid remaining.  The uterine cavity itself was clean.  At the end of the procedure, all instruments were removed from the vagina.  All sponge, lap, and instrument  counts were correct x2.  The patient went to recovery room in stable condition.     Tameia Rafferty L. Helane Rima, M.D.    [name]  D:  06/12/2014  T:  06/12/2014  Job:  [id number]

## 2014-06-12 NOTE — Transfer of Care (Signed)
Immediate Anesthesia Transfer of Care Note  Patient: Jade [name]  Procedure(s) Performed: Procedure(s): DILATATION AND CURETTAGE /HYSTEROSCOPY (N/A)  Patient Location: PACU  Anesthesia Type:General  Level of Consciousness: awake, alert , oriented and patient cooperative  Airway & Oxygen Therapy: Patient Spontanous Breathing and Patient connected to nasal cannula oxygen  Post-op Assessment: Report given to PACU RN and Post -op Vital signs reviewed and stable  Post vital signs: Reviewed and stable  Complications: No apparent anesthesia complications

## 2014-06-12 NOTE — Anesthesia Preprocedure Evaluation (Addendum)
Anesthesia Evaluation  Patient identified by MRN, date of birth, ID band Patient awake    Reviewed: Allergy & Precautions, H&P , NPO status , Patient's Chart, lab work & pertinent test results  Airway Mallampati: II TM Distance: >3 FB Neck ROM: Full    Dental no notable dental hx. (+) Dental Advisory Given, Teeth Intact   Pulmonary neg pulmonary ROS,  breath sounds clear to auscultation  Pulmonary exam normal       Cardiovascular negative cardio ROS  Rhythm:Regular Rate:Normal     Neuro/Psych negative neurological ROS  negative psych ROS   GI/Hepatic Neg liver ROS, hiatal hernia, GERD-  Medicated and Controlled,  Endo/Other  negative endocrine ROS  Renal/GU negative Renal ROS  negative genitourinary   Musculoskeletal negative musculoskeletal ROS (+)   Abdominal   Peds negative pediatric ROS (+)  Hematology negative hematology ROS (+)   Anesthesia Other Findings Full veneers on teeth  Reproductive/Obstetrics negative OB ROS                          Anesthesia Physical Anesthesia Plan  ASA: II  Anesthesia Plan: General   Post-op Pain Management:    Induction: Intravenous  Airway Management Planned: LMA  Additional Equipment:   Intra-op Plan:   Post-operative Plan: Extubation in OR  Informed Consent: I have reviewed the patients History and Physical, chart, labs and discussed the procedure including the risks, benefits and alternatives for the proposed anesthesia with the patient or authorized representative who has indicated his/her understanding and acceptance.   Dental advisory given  Plan Discussed with: CRNA  Anesthesia Plan Comments:         Anesthesia Quick Evaluation

## 2014-06-12 NOTE — Discharge Instructions (Signed)
° °  D & C Home care Instructions: ° ° °Personal hygiene:  Used sanitary napkins for vaginal drainage not tampons. Shower or tub bathe the day after your procedure. No douching until bleeding stops. Always wipe from front to back after  Elimination. ° °Activity: Do not drive or operate any equipment today. The effects of the anesthesia are still present and drowsiness may result. Rest today, not necessarily flat bed rest, just take it easy. You may resume your normal activity in one to 2 days. ° °Sexual activity: No intercourse for one week or as indicated by your physician ° °Diet: Eat a light diet as desired this evening. You may resume a regular diet tomorrow. ° °Return to work: One to 2 days. ° °General Expectations of your surgery: Vaginal bleeding should be no heavier than a normal period. Spotting may continue up to 10 days. Mild cramps may continue for a couple of days. You may have a regular period in 2-6 weeks. ° °Unexpected observations call your doctor if these occur: persistent or heavy bleeding. Severe abdominal cramping or pain. Elevation of temperature greater than 100°F. ° °Call for an appointment in one week. ° ° ° °Patient's Signature_______________________________________________________ ° °Nurse's Signature________________________________________________________ ° ° °Post Anesthesia Home Care Instructions ° °Activity: °Get plenty of rest for the remainder of the day. A responsible adult should stay with you for 24 hours following the procedure.  °For the next 24 hours, DO NOT: °-Drive a car °-Operate machinery °-Drink alcoholic beverages °-Take any medication unless instructed by your physician °-Make any legal decisions or sign important papers. ° °Meals: °Start with liquid foods such as gelatin or soup. Progress to regular foods as tolerated. Avoid greasy, spicy, heavy foods. If nausea and/or vomiting occur, drink only clear liquids until the nausea and/or vomiting subsides. Call your physician  if vomiting continues. ° °Special Instructions/Symptoms: °Your throat may feel dry or sore from the anesthesia or the breathing tube placed in your throat during surgery. If this causes discomfort, gargle with warm salt water. The discomfort should disappear within 24 hours. ° °

## 2014-06-13 ENCOUNTER — Encounter (HOSPITAL_BASED_OUTPATIENT_CLINIC_OR_DEPARTMENT_OTHER): Payer: Self-pay | Admitting: Obstetrics and Gynecology

## 2014-06-16 NOTE — Anesthesia Postprocedure Evaluation (Signed)
  Anesthesia Post-op Note  Patient: Jade [name]  Procedure(s) Performed: Procedure(s) (LRB): DILATATION AND CURETTAGE /HYSTEROSCOPY (N/A)  Patient Location: PACU  Anesthesia Type: General  Level of Consciousness: awake and alert   Airway and Oxygen Therapy: Patient Spontanous Breathing  Post-op Pain: mild  Post-op Assessment: Post-op Vital signs reviewed, Patient's Cardiovascular Status Stable, Respiratory Function Stable, Patent Airway and No signs of Nausea or vomiting  Last Vitals:  Filed Vitals:   06/12/14 0936  BP: 133/70  Pulse: 52  Temp: 36.4 C  Resp: 18    Post-op Vital Signs: stable   Complications: No apparent anesthesia complications

## 2015-10-25 ENCOUNTER — Encounter: Payer: Self-pay | Admitting: Gastroenterology

## 2015-12-19 ENCOUNTER — Ambulatory Visit: Payer: Self-pay | Admitting: Gastroenterology

## 2016-02-12 ENCOUNTER — Ambulatory Visit: Payer: Self-pay | Admitting: Gastroenterology

## 2016-02-26 ENCOUNTER — Telehealth: Payer: Self-pay | Admitting: Internal Medicine

## 2016-02-26 NOTE — Telephone Encounter (Signed)
Dr. Carlean Purl do you know anything about working this patient in?  According to the patient someone spoke to you about this last week?  I see no notes nor any recent chart activity

## 2016-02-26 NOTE — Telephone Encounter (Signed)
Patient notified and will come tomorrow at 9:00 for 9:15 appt

## 2016-02-26 NOTE — Telephone Encounter (Signed)
Yes we can work her in this week or next - perhaps tomorrow - Dr. Titus Mould asked and I had not yet told you

## 2016-02-27 ENCOUNTER — Encounter: Payer: Self-pay | Admitting: Internal Medicine

## 2016-02-27 ENCOUNTER — Ambulatory Visit (INDEPENDENT_AMBULATORY_CARE_PROVIDER_SITE_OTHER): Payer: 59 | Admitting: Internal Medicine

## 2016-02-27 VITALS — BP 130/70 | HR 80 | Ht 68.0 in | Wt 142.0 lb

## 2016-02-27 DIAGNOSIS — K589 Irritable bowel syndrome without diarrhea: Secondary | ICD-10-CM | POA: Diagnosis not present

## 2016-02-27 DIAGNOSIS — K602 Anal fissure, unspecified: Secondary | ICD-10-CM | POA: Diagnosis not present

## 2016-02-27 HISTORY — DX: Anal fissure, unspecified: K60.2

## 2016-02-27 MED ORDER — DILTIAZEM GEL 2 %: CUTANEOUS | Status: DC

## 2016-02-27 NOTE — Progress Notes (Signed)
Subjective:    Patient ID: Jade [name], female    DOB: 05/31/1956, 60 y.o.   MRN: [id number] Cc: diverticulitis  HPI Very nice married ww with hx of recurrent diverticulitis - clinical diagnosis - no CT diagnosis. Had a colonoscopy in  2009 or 2011 diverticulosis 3 episodes in last year of LLQ pain - has been Tx w/ Abx for presumptive diverticulitis. Has seen Dr. Helane Rima - ? Ovarian cysts -   Less pain in abdomen this time but  a lot of pressure and pain in rectum  BM's "all over the place" - diarrhea to mushy No bleeding  She started antibiotics  for recent sxs 5 d ago. Is not better  Has PCP in [place] - spends some time there - business is there Allergies  Allergen Reactions  . Phenergan [Promethazine] Other (See Comments)    Patient not sure of reaction   Outpatient Prescriptions Prior to Visit  Medication Sig Dispense Refill  . estrogen, conjugated,-medroxyprogesterone (PREMPRO) 0.625-2.5 MG per tablet Take 1 tablet by mouth every evening.    Marland Kitchen ibuprofen (ADVIL,MOTRIN) 200 MG tablet Take 200 mg by mouth every 6 (six) hours as needed.    . Multiple Vitamin (MULTIVITAMIN) tablet Take 1 tablet by mouth daily.    Marland Kitchen Dexlansoprazole 30 MG capsule Take 30 mg by mouth as needed.     No facility-administered medications prior to visit.   Past Medical History  Diagnosis Date  . Multiple thyroid nodules     BILATERAL PER CT  2012  . PMB (postmenopausal bleeding)   . GERD (gastroesophageal reflux disease)   . H/O hiatal hernia   . DDD (degenerative disc disease), cervical   . Diverticulosis   . Diverticulitis   . Pneumonia   . IBS (irritable bowel syndrome)   . Eyelid cancer   . Anal fissure 02/27/2016   Past Surgical History  Procedure Laterality Date  . Breast enhancement surgery  age 73  . Orbital rim reconstruction  2010    Eyelid CA  . Laparotomy ovarian cystectomy  age 65  . Tubal ligation  age 1's  . Dilation and curettage of uterus  age 79's  . D & c   hysteroscopy with resectoscopic removal fibroid  2009  . Colonoscopy  2011  . Wisdom tooth extraction  age 49  . Hysteroscopy w/d&c N/A 06/12/2014    Procedure: DILATATION AND CURETTAGE /HYSTEROSCOPY;  Surgeon: Cyril Mourning, MD;  Location: [facility];  Service: Gynecology;  Laterality: N/A;   Social History   Social History  . Marital Status: Married    Spouse Name: N/A  . Number of Children: 2  . 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 Smoking status: Never Smoker   . Smokeless tobacco: Never Used  . Alcohol Use: 0.0 oz/week    0 Standard drinks or equivalent per week     Comment: rarely 1-2 times per year  . Drug Use: No  . Sexual Activity: Not Asked   Other Topics Concern  . 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 Stroke Mother     Many people on mother's side have had strokes  . Lung cancer Father     smoker  . Heart attack Father   . Diabetes Brother   . Diabetes Paternal Grandmother   . Stroke Mother   . Stroke Maternal Grandmother   . Stroke Maternal Aunt     x 3  . Stroke Maternal Aunt    Review of Systems As per HPI and + allergy sxs, fatigue, stress urinary incontinene, night sweats    Objective:   Physical Exam @BP  130/70 mmHg  Pulse 80  Ht 5\' 8"  (1.727 m)  Wt 142 lb (64.411 kg)  BMI 21.60 kg/m2@  General:  Well-developed, well-nourished and in no acute distress Eyes:  anicteric. ENT:   Mouth and posterior pharynx free of lesions.  Neck:   supple w/o thyromegaly or mass.  Lungs: Clear to auscultation bilaterally. Heart:    S1S2, no rubs, murmurs, gallops. Abdomen:  soft, non-tender, no hepatosplenomegaly, hernia, or mass and BS+.  Rectal:  Female staff present:  Mild spasm, no mass, + tender posterior   Anoscopy was performed with the patient in the left lateral decubitus position while a chaperone was present and revealed posterior anal fissure  Lymph:  no cervical or supraclavicular adenopathy. Extremities:   no edema, cyanosis or clubbing Skin   no rash. Neuro:  A&O x 3.  Psych:  appropriate mood and  Affect.   Data Reviewed: Will request prior colonoscopy report     Assessment & Plan:   Encounter Diagnoses  Name Primary?  Marland Kitchen Anal fissure Yes  . IBS (irritable bowel syndrome)     Probiotic for altered bowel habits/IBS Anal fissure handout ROI colonoscopy Diltiazem 2% topical bid-tid RTC July Stop antibiotics

## 2016-02-27 NOTE — Patient Instructions (Addendum)
   We have sent the following medications to your pharmacy for you to pick up at your convenience: Diltiazem , faxed to [facility]   Today we are giving you a handout to read on fissures.   Follow up with [place] in July. She is going to call back to set it up, goes to [place] to August.   Stop your antibiotic per Dr Carlean Purl.   Please take a probiotic, either Align or VSL#3, coupons provided.  We will obtain your colon report from Dr Watt Climes.   I appreciate the opportunity to care for you.

## 2016-02-29 ENCOUNTER — Encounter: Payer: Self-pay | Admitting: Internal Medicine

## 2018-02-16 ENCOUNTER — Ambulatory Visit (HOSPITAL_COMMUNITY)
Admission: RE | Admit: 2018-02-16 | Discharge: 2018-02-16 | Disposition: A | Discharge status: Home / Self Care | Payer: 59 | Source: Ambulatory Visit | Attending: Obstetrics and Gynecology | Admitting: Obstetrics and Gynecology

## 2018-02-16 ENCOUNTER — Encounter (HOSPITAL_COMMUNITY): Payer: Self-pay

## 2018-02-16 ENCOUNTER — Other Ambulatory Visit (HOSPITAL_COMMUNITY): Payer: Self-pay | Admitting: Obstetrics and Gynecology

## 2018-02-16 DIAGNOSIS — R319 Hematuria, unspecified: Secondary | ICD-10-CM

## 2018-02-16 DIAGNOSIS — R3982 Chronic bladder pain: Secondary | ICD-10-CM | POA: Diagnosis present

## 2018-02-16 DIAGNOSIS — R102 Pelvic and perineal pain: Secondary | ICD-10-CM

## 2018-02-16 DIAGNOSIS — K769 Liver disease, unspecified: Secondary | ICD-10-CM | POA: Insufficient documentation

## 2018-02-16 DIAGNOSIS — R3989 Other symptoms and signs involving the genitourinary system: Secondary | ICD-10-CM

## 2018-02-16 DIAGNOSIS — E279 Disorder of adrenal gland, unspecified: Secondary | ICD-10-CM | POA: Diagnosis not present

## 2018-02-16 LAB — POCT I-STAT CREATININE: CREATININE: 0.8 mg/dL (ref 0.44–1.00)

## 2018-02-16 MED ORDER — IOHEXOL 300 MG/ML  SOLN
100.0000 mL | Freq: Once | INTRAMUSCULAR | Status: AC | PRN
  Administered 2018-02-16: 100 mL via INTRAVENOUS

## 2018-02-16 MED ORDER — IOPAMIDOL (ISOVUE-300) INJECTION 61%
INTRAVENOUS | Status: AC
  Filled 2018-02-16: qty 30

## 2018-02-16 MED ORDER — IOPAMIDOL (ISOVUE-300) INJECTION 61%
30.0000 mL | Freq: Once | INTRAVENOUS | Status: DC | PRN
  Administered 2018-02-16: 30 mL via ORAL
  Filled 2018-02-16: qty 30

## 2019-07-14 ENCOUNTER — Other Ambulatory Visit: Payer: Self-pay | Admitting: Obstetrics and Gynecology

## 2019-07-14 DIAGNOSIS — N644 Mastodynia: Secondary | ICD-10-CM

## 2019-07-20 ENCOUNTER — Ambulatory Visit
Admission: RE | Admit: 2019-07-20 | Discharge: 2019-07-20 | Disposition: A | Discharge status: Home / Self Care | Payer: 59 | Source: Ambulatory Visit | Attending: Obstetrics and Gynecology | Admitting: Obstetrics and Gynecology

## 2019-07-20 ENCOUNTER — Ambulatory Visit: Payer: 59

## 2019-07-20 ENCOUNTER — Other Ambulatory Visit: Payer: 59

## 2019-07-20 ENCOUNTER — Other Ambulatory Visit: Payer: Self-pay | Admitting: Obstetrics and Gynecology

## 2019-07-20 ENCOUNTER — Other Ambulatory Visit: Payer: Self-pay

## 2019-07-20 DIAGNOSIS — N644 Mastodynia: Secondary | ICD-10-CM

## 2019-07-28 ENCOUNTER — Other Ambulatory Visit: Payer: Self-pay

## 2019-07-28 ENCOUNTER — Ambulatory Visit
Admission: RE | Admit: 2019-07-28 | Discharge: 2019-07-28 | Disposition: A | Discharge status: Home / Self Care | Payer: 59 | Source: Ambulatory Visit | Attending: Obstetrics and Gynecology | Admitting: Obstetrics and Gynecology

## 2019-07-28 DIAGNOSIS — N644 Mastodynia: Secondary | ICD-10-CM

## 2019-07-29 ENCOUNTER — Other Ambulatory Visit: Payer: Self-pay | Admitting: Obstetrics and Gynecology

## 2019-07-29 DIAGNOSIS — E041 Nontoxic single thyroid nodule: Secondary | ICD-10-CM

## 2019-08-02 ENCOUNTER — Telehealth: Payer: Self-pay

## 2019-08-02 NOTE — Telephone Encounter (Signed)
NOTES ON FILE FROM DR MICHELLE GREWAL [phone],REFERRAL SENT TO SCHEDULING

## 2019-08-04 ENCOUNTER — Ambulatory Visit
Admission: RE | Admit: 2019-08-04 | Discharge: 2019-08-04 | Disposition: A | Discharge status: Home / Self Care | Payer: 59 | Source: Ambulatory Visit | Attending: Obstetrics and Gynecology | Admitting: Obstetrics and Gynecology

## 2019-08-04 DIAGNOSIS — E041 Nontoxic single thyroid nodule: Secondary | ICD-10-CM

## 2019-08-09 ENCOUNTER — Encounter: Payer: Self-pay | Admitting: Cardiovascular Disease

## 2019-08-09 ENCOUNTER — Other Ambulatory Visit: Payer: Self-pay

## 2019-08-09 ENCOUNTER — Ambulatory Visit: Payer: 59 | Admitting: Cardiovascular Disease

## 2019-08-09 VITALS — BP 132/70 | HR 68 | Temp 97.9°F | Ht 68.0 in | Wt 134.0 lb

## 2019-08-09 DIAGNOSIS — R079 Chest pain, unspecified: Secondary | ICD-10-CM

## 2019-08-09 DIAGNOSIS — R0789 Other chest pain: Secondary | ICD-10-CM | POA: Diagnosis not present

## 2019-08-09 DIAGNOSIS — Z131 Encounter for screening for diabetes mellitus: Secondary | ICD-10-CM | POA: Diagnosis not present

## 2019-08-09 DIAGNOSIS — E279 Disorder of adrenal gland, unspecified: Secondary | ICD-10-CM

## 2019-08-09 DIAGNOSIS — D3500 Benign neoplasm of unspecified adrenal gland: Secondary | ICD-10-CM

## 2019-08-09 DIAGNOSIS — D490 Neoplasm of unspecified behavior of digestive system: Secondary | ICD-10-CM

## 2019-08-09 DIAGNOSIS — Z13220 Encounter for screening for lipoid disorders: Secondary | ICD-10-CM

## 2019-08-09 MED ORDER — METOPROLOL TARTRATE 100 MG PO TABS: 100.0000 mg | ORAL_TABLET | Freq: Once | ORAL | 0 refills | Status: DC

## 2019-08-09 NOTE — Patient Instructions (Addendum)
Your cardiac CT will be scheduled at one of the below locations:   [facility] [address] [place], [place] [address] [phone] [place] [address] [place], [place] [address] [phone]  If scheduled at [facility], please arrive at the [facility] main entrance of [facility] 30-45 minutes prior to test start time. Proceed to the [facility] Radiology Department (first floor) to check-in and test prep.  If scheduled at [facility], please arrive 15 mins early for check-in and test prep.  Please follow these instructions carefully (unless otherwise directed):   On the Night Before the Test: . Be sure to Drink plenty of water. . Do not consume any caffeinated/decaffeinated beverages or chocolate 12 hours prior to your test. . Do not take any antihistamines 12 hours prior to your test.  On the Day of the Test: . Drink plenty of water. Do not drink any water within one hour of the test. . Do not eat any food 4 hours prior to the test. . You may take your regular medications prior to the test.  . Take metoprolol (Lopressor) 100 mg two hours prior to test. . FEMALES- please wear underwire-free bra if available       After the Test: . Drink plenty of water. . After receiving IV contrast, you may experience a mild flushed feeling. This is normal. . On occasion, you may experience a mild rash up to 24 hours after the test. This is not dangerous. If this occurs, you can take Benadryl 25 mg and increase your fluid intake. . If you experience trouble breathing, this can be serious. If it is severe call 911 IMMEDIATELY. If it is mild, please call our office.    Please contact the cardiac imaging nurse navigator should you have any questions/concerns Marchia Bond, RN Navigator Cardiac Imaging [facility] [phone] Office   _______________________________________________________________  Medication Instructions:   If you need a refill on your cardiac medications before your next appointment, please call your pharmacy.   Lab work: Your physician recommends that you return for lab work: CBC, TSH, BMP, LIPID AND LIVER PANELS, HEMOGLOBIN A1C  If you have labs (blood work) drawn today and your tests are completely normal, you will receive your results only by: Marland Kitchen MyChart Message (if you have MyChart) OR . A paper copy in the mail If you have any lab test that is abnormal or we need to change your treatment, we will call you to review the results.  Testing/Procedures: Your physician has requested that you have an MRI of your abdomen. Magnetic Resonance Imaging (MRI) Magnetic resonance imaging (MRI) is a painless test that takes pictures of the inside of your body. This test uses a strong magnet. This test does not use X-rays. What happens before the procedure?  You will be asked about any metal you may have in your body. This includes: ? Any joint replacement (prosthesis), such as an artificial knee or hip. ? Any implanted devices or ports. ? A metallic ear implant (cochlear implant). ? An artificial heart valve. ? A metallic object in the eye. ? Metal splinters. ? Bullet fragments. ? Any tattoos. Some of the darker inks can cause problems with testing.  You will be asked to take off all metal. This includes: ? Your watch, jewelry, and other metal objects. ? Hearing aids. ? Dentures. ? Underwire bra. ? Makeup. Some  kinds of makeup contain small amounts of metal. ? Braces and fillings normally are not a problem.  If you are breastfeeding, ask your doctor if you need to pump before your test and then stop breastfeeding for a short time. You may need to do this if dye (contrast material) will be used during your MRI.  If you have a fear of cramped spaces (claustrophobia), you may be given medicines prior  to the MRI. What happens during the procedure?   You may be given earplugs or headphones to listen to music. The MRI machine can be noisy.  You will lie down on a platform. It looks like a long table.  If dye will be used, an IV tube will be placed into one of your veins. Dye will be given through your IV tube at a certain time as images are taken.  The platform will slide into a tunnel. The tunnel has magnets inside of it. When you are inside the tunnel, you will still be able to talk to your doctor.  The tunnel will scan your body and make images. You will be asked to lie very still. Your doctor will tell you when you can move. You may have to wait a few minutes to make sure that the images from the test are clear.  When all images are taken, the platform will slide out of the tunnel. The procedure may vary among doctors and hospitals. What happens after the procedure?  You may be taken to a recovery area if sedation medicines were used. Your blood pressure, heart rate, breathing rate, and blood oxygen level will be monitored until you leave the hospital or clinic.  If dye was used: ? It will leave your body through your pee (urine). It takes about a day for all of the dye to leave the body. ? You may be told to drink plenty of fluids. This helps your body get rid of the dye. ? If you are breastfeeding, do not breastfeed your child until your doctor says that this is safe.  You may go back to your normal activities right away, or as told by your doctor.  It is up to you to get your test results. Ask your doctor, or the department that is doing the test, when your results will be ready. Summary  Magnetic resonance imaging (MRI) is a test that takes pictures of the inside of your body.  Before your MRI, be sure to tell your doctor about any metal you may have in your body.  You may go back to your normal activities right away, or as told by your doctor. This information is not  intended to replace advice given to you by your health care provider. Make sure you discuss any questions you have with your health care provider. Document Released: 11/08/2010 Document Revised: 08/31/2017 Document Reviewed: 08/31/2017 Elsevier Patient Education  [place]: At [facility], you and your health needs are our priority.  As part of our continuing mission to provide you with exceptional heart care, we have created designated Provider Care Teams.  These Care Teams include your primary Cardiologist (physician) and Advanced Practice Providers (APPs -  Physician Assistants and Nurse Practitioners) who all work together to provide you with the care you need, when you need it. . You may schedule a follow up appointment as needed with Dr. Quay Burow. You will be contacted with your test results.

## 2019-08-09 NOTE — Assessment & Plan Note (Signed)
Jade [name] was referred to me by Dr. Helane Rima , her primary care physician/OB/GYN for evaluation of atypical chest pain.  Her only cardiac risk factor is family history with a brother who has stents and a father has had an MI in his 75s.  She is never had a heart attack or stroke.  She did see Dr. Jeffie Pollock on 09/10/2010 who ordered a coronary CTA that showed a coronary calcium score of 0.  Her chest pain at that time resolved but she has had recurrent symptoms over the last several months occurring daily.  The discomfort is described as a pressure sensation across her upper chest at the level of her breasts can last off and on all day without other associated symptoms.  I am going to get a coronary CTA to further evaluate.

## 2019-08-09 NOTE — Progress Notes (Signed)
08/09/2019 Jade [name]   1956-03-24  [id number]  Primary Physician Patient, No Pcp Per Primary Cardiologist: Lorretta Harp MD Renae Gloss  HPI:  Jade [name] is a 63 y.o. thin and fit appearing married Caucasian female mother of 2 children, grandmother 3 grandchildren referred by her PCP/OB/GYN Dr. Dian Queen for cardiovascular dilation because of atypical chest pain.  Her only risk factor includes family history with a brother who has had stents and father had a myocardial infarction in his 84s.  She is never had a heart attack or stroke.  She has no other cardiac risk factors.  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  She is fairly active and is raising her grandchild.  She did have chest pain back in 2011 and saw Dr. Jeffie Pollock on 09/10/2010 where the coronary CTA that showed a coronary calcium score of 0 with no evidence of CAD.  Pain at that time resolved.  She is had recurrent chest pain over last several months occurring on a daily basis described as a pressure across her chest lasting for hours at a time.   Current Meds  Medication Sig  . estrogen, conjugated,-medroxyprogesterone (PREMPRO) 0.625-2.5 MG per tablet Take 1 tablet by mouth every evening.  . Multiple Vitamin (MULTIVITAMIN) tablet Take 1 tablet by mouth daily.     Allergies  Allergen Reactions  . Phenergan [Promethazine] Other (See Comments)    Patient not sure of reaction    Social History   Socioeconomic History  . Marital status: Married    Spouse name: Not on file  . Number of children: 2  . Years of education: Not on file  . Highest education level: Not on file  Occupational History  . 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 Financial resource strain: Not on file  . Food insecurity    Worry: Not on file    Inability: Not on file  . Transportation needs    Medical: Not on file    Non-medical: Not on file  Tobacco Use  . Smoking status: Never  Smoker  . Smokeless tobacco: Never Used  Substance and Sexual Activity  . Alcohol use: Yes    Alcohol/week: 0.0 standard drinks    Comment: rarely 1-2 times per year  . Drug use: No  . Sexual activity: Not on file  Lifestyle  . Physical activity    Days per week: Not on file    Minutes per session: Not on file  . Stress: Not on file  Relationships  . Social Herbalist on phone: Not on file    Gets together: Not on file    Attends religious service: Not on file    Active member of club or organization: Not on file    Attends meetings of clubs or organizations: Not on file    Relationship status: Not on file  . Intimate partner violence    Fear of current or ex partner: Not on file    Emotionally abused: Not on file    Physically abused: Not on file    Forced sexual activity: Not on file  Other Topics Concern  . 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  Cardiovascular: negative for chest pain, dyspnea on exertion, edema, orthopnea, palpitations, paroxysmal nocturnal dyspnea or shortness of breath Dermatological: negative for rash Respiratory: negative for cough or wheezing Urologic: negative for hematuria Abdominal: negative for nausea, vomiting, diarrhea, bright red blood per rectum, melena, or hematemesis Neurologic: negative for visual changes, syncope, or dizziness All other systems reviewed and are otherwise negative except as noted above.    Blood pressure 132/70, pulse 68, temperature 97.9 F (36.6 C), height 5\' 8"  (1.727 m), weight 134 lb (60.8 kg).  General appearance: alert and  no distress Neck: no adenopathy, no carotid bruit, no JVD, supple, symmetrical, trachea midline and thyroid not enlarged, symmetric, no tenderness/mass/nodules Lungs: clear to auscultation bilaterally Heart: regular rate and rhythm, S1, S2 normal, no murmur, click, rub or gallop Extremities: extremities normal, atraumatic, no cyanosis or edema Pulses: 2+ and symmetric Skin: Skin color, texture, turgor normal. No rashes or lesions Neurologic: Alert and oriented X 3, normal strength and tone. Normal symmetric reflexes. Normal coordination and gait  EKG sinus rhythm at 68 with nonspecific ST and T wave changes.  I personally reviewed this EKG.  ASSESSMENT AND PLAN:   CHEST TIGHTNESS-PRESSURE-OTHER Jade [name] was referred to me by Dr. Helane Rima , her primary care physician/OB/GYN for evaluation of atypical chest pain.  Her only cardiac risk factor is family history with a brother who has stents and a father has had an MI in his 38s.  She is never had a heart attack or stroke.  She did see Dr. Jeffie Pollock on 09/10/2010 who ordered a coronary CTA that showed a coronary calcium score of 0.  Her chest pain at that time resolved but she has had recurrent symptoms over the last several months occurring daily.  The discomfort is described as a pressure sensation across her upper chest at the level of her breasts can last off and on all day without other associated symptoms.  I am going to get a coronary CTA to further evaluate.      Lorretta Harp MD FACP,FACC,FAHA, [facility] 08/09/2019 2:24 PM

## 2019-08-11 LAB — HEPATIC FUNCTION PANEL
ALT: 14 IU/L (ref 0–32)
AST: 14 IU/L (ref 0–40)
Albumin: 4.3 g/dL (ref 3.8–4.8)
Alkaline Phosphatase: 66 IU/L (ref 39–117)
Bilirubin Total: 0.8 mg/dL (ref 0.0–1.2)
Bilirubin, Direct: 0.2 mg/dL (ref 0.00–0.40)
Total Protein: 6.2 g/dL (ref 6.0–8.5)

## 2019-08-11 LAB — LIPID PANEL
Chol/HDL Ratio: 2.6 ratio (ref 0.0–4.4)
Cholesterol, Total: 160 mg/dL (ref 100–199)
HDL: 62 mg/dL (ref >39)
LDL Chol Calc (NIH): 83 mg/dL (ref 0–99)
Triglycerides: 82 mg/dL (ref 0–149)
VLDL Cholesterol Cal: 15 mg/dL (ref 5–40)

## 2019-08-12 LAB — HEMOGLOBIN A1C
Est. average glucose Bld gHb Est-mCnc: 111 mg/dL
Est. average glucose Bld gHb Est-mCnc: 114 mg/dL
Hgb A1c MFr Bld: 5.5 % (ref 4.8–5.6)
Hgb A1c MFr Bld: 5.6 % (ref 4.8–5.6)

## 2019-08-12 LAB — BASIC METABOLIC PANEL
BUN/Creatinine Ratio: 15 (ref 12–28)
BUN: 11 mg/dL (ref 8–27)
CO2: 21 mmol/L (ref 20–29)
Calcium: 9 mg/dL (ref 8.7–10.3)
Chloride: 104 mmol/L (ref 96–106)
Creatinine, Ser: 0.71 mg/dL (ref 0.57–1.00)
GFR calc Af Amer: 105 mL/min/{1.73_m2} (ref >59)
GFR calc non Af Amer: 91 mL/min/{1.73_m2} (ref >59)
Glucose: 76 mg/dL (ref 65–99)
Potassium: 4.2 mmol/L (ref 3.5–5.2)
Sodium: 140 mmol/L (ref 134–144)

## 2019-08-12 LAB — CBC
Hematocrit: 43.2 % (ref 34.0–46.6)
Hemoglobin: 14.7 g/dL (ref 11.1–15.9)
MCH: 31.1 pg (ref 26.6–33.0)
MCHC: 34 g/dL (ref 31.5–35.7)
MCV: 92 fL (ref 79–97)
Platelets: 225 10*3/uL (ref 150–450)
RBC: 4.72 x10E6/uL (ref 3.77–5.28)
RDW: 11.9 % (ref 11.7–15.4)
WBC: 4.5 10*3/uL (ref 3.4–10.8)

## 2019-08-12 LAB — TSH: TSH: 1.9 u[IU]/mL (ref 0.450–4.500)

## 2019-08-17 ENCOUNTER — Ambulatory Visit (HOSPITAL_COMMUNITY): Payer: 59

## 2019-08-31 ENCOUNTER — Ambulatory Visit (HOSPITAL_COMMUNITY): Admission: RE | Admit: 2019-08-31 | Payer: 59 | Source: Ambulatory Visit

## 2019-09-12 ENCOUNTER — Telehealth: Payer: Self-pay

## 2019-09-12 NOTE — Telephone Encounter (Signed)

## 2019-09-13 ENCOUNTER — Ambulatory Visit (INDEPENDENT_AMBULATORY_CARE_PROVIDER_SITE_OTHER): Payer: 59 | Admitting: Plastic Surgery

## 2019-09-13 ENCOUNTER — Other Ambulatory Visit: Payer: Self-pay

## 2019-09-13 ENCOUNTER — Encounter: Payer: Self-pay | Admitting: Plastic Surgery

## 2019-09-13 DIAGNOSIS — T8543XA Leakage of breast prosthesis and implant, initial encounter: Secondary | ICD-10-CM

## 2019-09-13 NOTE — Progress Notes (Addendum)
Patient ID: Jade [name], female    DOB: 13-Aug-1956, 63 y.o.   MRN: [id number]   Chief Complaint  Patient presents with  . Breast Problem    The patient is a 63 year old female here for evaluation of the breast implants.  She had implants put in by Dr. Fraser Din 40 years ago.  She went from a B cup to a C cup at that time.  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  She had her kids starting at 54 years of age.  She underwent a mammogram 2 months ago and biopsy of the left breast.  The biopsy was negative.  She was told that both implants were ruptured.  She was not sure if they were silicone or saline.  On exam they definitely feel like silicone implants.  They are very firm and irregular.  She has capsular contractures on both sides.  She is aware of how firm they are.  She does not have a family history of breast cancer.  She had a heart catheterization a year ago and is doing well from that.  She is 5 feet 8 inches tall and weighs 131 pounds.  She is married.  Her husband is much older than her.  She has a 27 year old grand daughter who she is raising.  She is interested in removal of the ruptured implants and possible replacement.  She has grade 3 breast ptosis.  She is otherwise in very good health.   Review of Systems  Constitutional: Negative for activity change and appetite change.  Eyes: Negative.   Respiratory: Negative.  Negative for chest tightness and shortness of breath.   Cardiovascular: Negative.   Gastrointestinal: Negative.  Negative for abdominal pain.  Endocrine: Negative.   Genitourinary: Negative.   Musculoskeletal: Negative.   Skin: Negative for color change and wound.  Hematological: Negative.     Past Medical History:  Diagnosis Date  . Anal fissure 02/27/2016  . DDD (degenerative disc disease), cervical   . Diverticulitis   . Diverticulosis   . Eyelid cancer   . GERD (gastroesophageal reflux disease)   . H/O hiatal hernia    . IBS (irritable bowel syndrome)   . Multiple thyroid nodules    BILATERAL PER CT  2012  . PMB (postmenopausal bleeding)   . Pneumonia     Past Surgical History:  Procedure Laterality Date  . BREAST ENHANCEMENT SURGERY  age 89  . COLONOSCOPY  2011  . D & C  HYSTEROSCOPY WITH RESECTOSCOPIC REMOVAL FIBROID  2009  . DILATION AND CURETTAGE OF UTERUS  age 13's  . HYSTEROSCOPY W/D&C N/A 06/12/2014   Procedure: DILATATION AND CURETTAGE /HYSTEROSCOPY;  Surgeon: Cyril Mourning, MD;  Location: [facility];  Service: Gynecology;  Laterality: N/A;  . LAPAROTOMY OVARIAN CYSTECTOMY  age 46  . ORBITAL RIM RECONSTRUCTION  2010   Eyelid CA  . TUBAL LIGATION  age 85's  . WISDOM TOOTH EXTRACTION  age 3      Current Outpatient Medications:  .  estrogen, conjugated,-medroxyprogesterone (PREMPRO) 0.625-2.5 MG per tablet, Take 1 tablet by mouth every evening., Disp: , Rfl:  .  metoprolol tartrate (LOPRESSOR) 100 MG tablet, Take 1 tablet (100 mg total) by mouth once for 1 dose. Take one tablet two hours prior to your coronary CTA, Disp: 1 tablet, Rfl: 0 .  Multiple Vitamin (MULTIVITAMIN) tablet, Take 1 tablet by mouth daily., Disp: , Rfl:  Objective:   Vitals:   09/13/19 1006  BP: (!) 142/79  Pulse: 62  Temp: 97.7 F (36.5 C)  SpO2: 100%    Physical Exam Vitals signs and nursing note reviewed.  Constitutional:      Appearance: Normal appearance.  HENT:     Head: Normocephalic and atraumatic.  Eyes:     Extraocular Movements: Extraocular movements intact.  Cardiovascular:     Rate and Rhythm: Normal rate.     Pulses: Normal pulses.  Pulmonary:     Effort: Pulmonary effort is normal.  Abdominal:     General: Abdomen is flat. There is no distension.     Tenderness: There is no abdominal tenderness.  Skin:    General: Skin is warm.  Neurological:     General: No focal deficit present.     Mental Status: She is alert.  Psychiatric:        Mood and Affect: Mood  normal.        Behavior: Behavior normal.        Thought Content: Thought content normal.     Assessment & Plan:  Rupture of implant of left breast, initial encounter  Rupture of implant of right breast, initial encounter  Recommend removal of bilateral ruptured breast implants.  She would do well with a mastopexy at the same time.  She is also thinking about replacement with smaller silicone implants.  We will need her implant sizes from her records. I reviewed the risks and complications with her age and the complications with implants.  She is aware that the absolute safest is removal of the implants the next safest option is removal of implants and mastopexy. Pictures were obtained of the patient and placed in the chart with the patient's or guardian's permission.  [place], DO

## 2019-10-27 ENCOUNTER — Ambulatory Visit (HOSPITAL_COMMUNITY): Payer: 59

## 2019-11-09 ENCOUNTER — Ambulatory Visit (HOSPITAL_COMMUNITY): Payer: 59

## 2019-11-11 ENCOUNTER — Telehealth: Payer: Self-pay | Admitting: Cardiovascular Disease

## 2019-11-11 NOTE — Telephone Encounter (Signed)
Left message for patient to call and reschedule MRI abdomen

## 2019-11-16 NOTE — Telephone Encounter (Signed)
Left message forpatient to call and reschedule MRI abdomen at [place]

## 2019-11-22 NOTE — Telephone Encounter (Signed)
Will route to MD to advise.   Thank you!   

## 2019-11-22 NOTE — Telephone Encounter (Signed)
Called to reschedule MR of abdomen and patient has asked if we can include mr of pelvis as well--states she is still having issues with her bladder.

## 2019-11-23 NOTE — Telephone Encounter (Signed)
Fine with me

## 2019-11-28 ENCOUNTER — Other Ambulatory Visit: Payer: Self-pay

## 2019-11-28 DIAGNOSIS — N3281 Overactive bladder: Secondary | ICD-10-CM

## 2019-11-28 NOTE — Telephone Encounter (Signed)
New message   I called patient to schedule cardiac ct , she states she wants to have MRI done first, she would like Dr Gwenlyn Found to add Pelvic Mri because she feels she is having a problem with her bladder as well.  Please call

## 2019-11-28 NOTE — Telephone Encounter (Signed)
Spoke with patient to schedule MRA abd/pelvis at [facility] states she is in  [place] and will call me when she gets back

## 2020-01-16 ENCOUNTER — Telehealth: Payer: Self-pay | Admitting: Cardiovascular Disease

## 2020-01-16 NOTE — Telephone Encounter (Signed)
Called patient no answer.LMTC. 

## 2020-01-16 NOTE — Telephone Encounter (Signed)
Returned all to pt she states that this pain is in the middle of her chest and is both exertional and at rest. She states that she has SOB and nausea and burping with exercertion. She states that she will NOT schedule an appointment with a PA she states that she will call and schedule an appointment at a later date. I expressed that she should schedule an appointment ASAP but she declines at this time because it is not with MD/ Dr Gwenlyn Found. Verbalizes understanding. Will forward to Dr Gwenlyn Found to review

## 2020-01-16 NOTE — Telephone Encounter (Signed)
Pt c/o of Chest Pain: STAT if CP now or developed within 24 hours  1. Are you having CP right now? No   2. Are you experiencing any other symptoms (ex. SOB, nausea, vomiting, sweating)? No   3. How long have you been experiencing CP? A few weeks   4. Is your CP continuous or coming and going? Coming and going   5. Have you taken Nitroglycerin? No   Pt c/o Shortness Of Breath: STAT if SOB developed within the last 24 hours or pt is noticeably SOB on the phone  1. Are you currently SOB (can you hear that pt is SOB on the phone)? No   2. How long have you been experiencing SOB? A few weeks   3. Are you SOB when sitting or when up moving around? Moving around   4. Are you currently experiencing any other symptoms? No   Jade [name] is calling to report her recent CP and SOB. She states the both occur upon exertion and she also experiences nausea. This has been going on for the past few weeks, but she hasn't really paid it much mind due to thinking it was from stress after recently losing two family members in February.  Jade [name] states a friend of hers that works at a hospital feels she needs to be seen today in regards to this, but there was no available appointments with Dr. Gwenlyn Found until 02/14/20 and she does not want to see a PA. Please advise.    ?

## 2020-01-17 ENCOUNTER — Ambulatory Visit: Payer: 59 | Admitting: Cardiology

## 2020-01-17 ENCOUNTER — Encounter: Payer: Self-pay | Admitting: Cardiology

## 2020-01-17 ENCOUNTER — Other Ambulatory Visit: Payer: Self-pay

## 2020-01-17 DIAGNOSIS — Z8249 Family history of ischemic heart disease and other diseases of the circulatory system: Secondary | ICD-10-CM | POA: Diagnosis not present

## 2020-01-17 DIAGNOSIS — F439 Reaction to severe stress, unspecified: Secondary | ICD-10-CM | POA: Diagnosis not present

## 2020-01-17 DIAGNOSIS — R079 Chest pain, unspecified: Secondary | ICD-10-CM

## 2020-01-17 DIAGNOSIS — R072 Precordial pain: Secondary | ICD-10-CM

## 2020-01-17 MED ORDER — ASPIRIN EC 81 MG PO TBEC: 81.0000 mg | DELAYED_RELEASE_TABLET | Freq: Every day | ORAL | 3 refills | Status: AC

## 2020-01-17 MED ORDER — ISOSORBIDE MONONITRATE ER 30 MG PO TB24: 30.0000 mg | ORAL_TABLET | Freq: Every day | ORAL | 3 refills | Status: DC

## 2020-01-17 NOTE — Assessment & Plan Note (Signed)
Father and brother have a history of CAD

## 2020-01-17 NOTE — Assessment & Plan Note (Signed)
Pt's husband (PE) and daughter(OD) died within an few hours of each other a few weeks ago

## 2020-01-17 NOTE — Progress Notes (Signed)
Cardiology Office Note:    Date:  01/17/2020   ID:  Jade [name], DOB [birth year], MRN [id number]  PCP:  Patient, No Pcp Per  Cardiologist:  Dr Jade [name] Electrophysiologist:  None   Referring MD: No ref. provider [name]   CC: chest pain  History of Present Illness:    Jade [name] is a 64 y.o. female with a hx of chest pain in the past and a family history of CAD.  Her brother had CAD and stents placed, her father had an MI in his 65's. She did have an evaluation in 2011 for chest pain, coronary CTA was normal and her Ca++ score was 0.  She saw Dr Jade [name] in Oct 2020 again with complaints of chest pain.  Coronary CTA was ordered but the patient never followed through with scheduling and her symptoms resolved.   She called the office 01/16/2020 with complaints of SSCP.  Tragically the patients husband and daughter died at the end of December 15, 2022 within hours of each other.  She thinks her husband died of a PE and her daughter was an overdose.  The patient has been the primary caregiver for her 12 y/o granddaughter. Since this happened she has understandably been under a great deal of stress.  She has noted some DOE.  She has intermittent SSCP described as pressure. She denies radiation to her arms or jaw.  She mentioned this to a family friend (Dr Jade [name]) who suggested she see cardiologist right away.  The patient was added on the my schedule today for further evaluation.    Past Medical History:  Diagnosis Date  . Anal fissure 02/27/2016  . DDD (degenerative disc disease), cervical   . Diverticulitis   . Diverticulosis   . Eyelid cancer   . GERD (gastroesophageal reflux disease)   . H/O hiatal hernia   . IBS (irritable bowel syndrome)   . Multiple thyroid nodules    BILATERAL PER CT  2012  . PMB (postmenopausal bleeding)   . Pneumonia     Past Surgical History:  Procedure Laterality Date  . BREAST ENHANCEMENT SURGERY  age 43  . COLONOSCOPY  2011  . D & C  HYSTEROSCOPY WITH  RESECTOSCOPIC REMOVAL FIBROID  2009  . DILATION AND CURETTAGE OF UTERUS  age 52's  . HYSTEROSCOPY WITH D & C N/A 06/12/2014   Procedure: DILATATION AND CURETTAGE /HYSTEROSCOPY;  Surgeon: Cyril Mourning, MD;  Location: [place];  Service: Gynecology;  Laterality: N/A;  . LAPAROTOMY OVARIAN CYSTECTOMY  age 89  . ORBITAL RIM RECONSTRUCTION  2010   Eyelid CA  . TUBAL LIGATION  age 37's  . WISDOM TOOTH EXTRACTION  age 76    Current Medications: Current Meds  Medication Sig  . estrogen, conjugated,-medroxyprogesterone (PREMPRO) 0.625-2.5 MG per tablet Take 1 tablet by mouth every evening.  . Multiple Vitamin (MULTIVITAMIN) tablet Take 1 tablet by mouth daily.  . nitroGLYCERIN (NITROSTAT) 0.4 MG SL tablet Place 0.4 mg under the tongue every 5 (five) minutes as needed for chest pain.     Allergies:   Phenergan [promethazine]   Social History   Socioeconomic History  . Marital status: Married    Spouse name: Not on file  . Number of children: 2  . Years of education: Not on file  . Highest education level: Not on file  Occupational History  . 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 Smoking status: Never Smoker  . Smokeless tobacco: Never Used  Substance  and Sexual Activity  . Alcohol use: Yes    Alcohol/week: 0.0 standard drinks    Comment: rarely 1-2 times per year  . Drug use: No  . Sexual activity: Not on file  Other Topics Concern  . 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 Difficulty of Paying Living Expenses:   Food Insecurity:   . Worried About Charity fundraiser in the Last Year:   .  Arboriculturist in the Last Year:   Transportation Needs:   . Film/video editor (Medical):   Marland Kitchen Lack of Transportation (Non-Medical):   Physical Activity:   . Days of Exercise per Week:   . Minutes of Exercise per Session:   Stress:   . Feeling of Stress :   Social Connections:   . Frequency of Communication with Friends and Family:   . Frequency of Social Gatherings with Friends and Family:   . Attends Religious Services:   . Active Member of Clubs or Organizations:   . Attends Archivist Meetings:   Marland Kitchen Marital Status:      Family History: The patient's family history includes Diabetes in her brother and paternal grandmother; Heart attack in her father; Lung cancer in her father; Stroke in her maternal aunt, maternal aunt, maternal grandmother, and mother.  ROS:   Please see the history of present illness.    All other systems reviewed and are negative.  EKGs/Labs/Other Studies Reviewed:    The following studies were reviewed today: none  EKG:  EKG is ordered today.  The ekg ordered today demonstrates NSR, HR 59- no acute changes  Recent Labs: 08/11/2019: ALT 14; BUN 11; Creatinine, Ser 0.71; Hemoglobin 14.7; Platelets 225; Potassium 4.2; Sodium 140; TSH 1.900  Recent Lipid Panel    Component Value Date/Time   CHOL 160 08/11/2019 0946   TRIG 82 08/11/2019 0946   HDL 62 08/11/2019 0946   CHOLHDL 2.6 08/11/2019 0946   LDLCALC 83 08/11/2019 0946    Physical Exam:    VS:  BP 140/74   Pulse (!) 59   Temp (!) 94.6 F (34.8 C)   Ht 5\' 8"  (1.727 m)   Wt 132 lb 6.4 oz (60.1 kg)   SpO2 99%   BMI 20.13 kg/m     Wt Readings from Last 3 Encounters:  01/17/20 132 lb 6.4 oz (60.1 kg)  09/13/19 131 lb (59.4 kg)  08/09/19 134 lb (60.8 kg)     GEN:  Well nourished, anxious, well developed in no acute distress HEENT: Normal NECK: No JVD; No carotid bruits CARDIAC: RRR, no murmurs, rubs, gallops RESPIRATORY:  Clear to auscultation without rales, wheezing or  rhonchi  ABDOMEN: Soft, non-tender, non-distended MUSCULOSKELETAL:  No edema; No deformity  SKIN: Warm and dry NEUROLOGIC:  Alert and oriented x 3 PSYCHIATRIC:  Normal affect   ASSESSMENT:    Chest pain with moderate risk for cardiac etiology Chest pain- symptoms concerning for exertional angina- two weeks after extreme emotional stress  Stress Pt's husband (PE) and daughter(OD) died within an few hours of each  other a few weeks ago  Family history of coronary artery disease Father and brother have a history of CAD  PLAN:    R/O possible Takostubo event- though the patient has no EKG changes or evidence of CHF on exam. Check echo ASAP. If normal proceed with coronary CTA.  If she has a WMA that would suggest an MI or CAD would re evaluate and consider coronary angiogram.  I did add ASA 81 mg daily and asked her to start Imdur 30 mg daily.  She already has SL NTG.    Medication Adjustments/Labs and Tests Ordered: Current medicines are reviewed at length with the patient today.  Concerns regarding medicines are outlined above.  Orders Placed This Encounter  Procedures  . Basic metabolic panel  . EKG 12-Lead  . ECHOCARDIOGRAM COMPLETE   Meds ordered this encounter  Medications  . aspirin EC 81 MG tablet    Sig: Take 1 tablet (81 mg total) by mouth daily.    Dispense:  90 tablet    Refill:  3  . isosorbide mononitrate (IMDUR) 30 MG 24 hr tablet    Sig: Take 1 tablet (30 mg total) by mouth daily. 1/2 TAB x3D THEN 30MG  QD    Dispense:  33 tablet    Refill:  3    Patient Instructions  Medication Instructions:  START ASPIRIN 81MG  DAILY  START ISOSORBIDE 15MG (1/2 TABLET) DAILY x3 DAYS; THEN 30MG  (WHOLE TABLET) DAILY *If you need a refill on your cardiac medications before your next appointment, please call your pharmacy*  Lab Work: BMET [place] If you have labs (blood work) drawn today and your tests are completely normal, you will receive your  results only by:  [place] (if you have MyChart) OR A paper copy in the mail.  If you have any lab test that is abnormal or we need to change your treatment, we will call you to review the results.  Testing/Procedures: Echocardiogram - Your physician has requested that you have an echocardiogram. Echocardiography is a painless test that uses sound waves to create images of your heart. It provides your doctor with information about the size and shape of your heart and how well your heart's chambers and valves are working. This procedure takes approximately one hour. There are no restrictions for this procedure. This will be performed at our [facility] location - [address], [address].  YOU ARE SCHEDULED FOR Thursday April 1st, 2021 AT 8AM.   Your physician has requested that you have an CT-A   Follow-Up: Your next appointment:   AFTER CT  In Person with Quay Burow, MD.  At [facility], you and your health needs are our priority.  As part of our continuing mission to provide you with exceptional heart care, we have created designated Provider Care Teams.  These Care Teams include your primary Cardiologist (physician) and Advanced Practice Providers (APPs -  Physician Assistants and Nurse Practitioners) who all work together to provide you with the care you need, when you need it.  We recommend signing up for the patient portal called "MyChart".  Sign up information is provided on this After Visit Summary.  MyChart is used to connect with patients for Virtual Visits (Telemedicine).  Patients are able to view lab/test results, encounter notes, upcoming appointments, etc.  Non-urgent messages can be sent to your provider as well.   To learn more about what you can do with MyChart, go to [email or web address].  Angelena Form, PA-C  01/17/2020 4:37 PM    [facility]

## 2020-01-17 NOTE — Assessment & Plan Note (Signed)
Chest pain- symptoms concerning for exertional angina- two weeks after extreme emotional stress

## 2020-01-17 NOTE — Patient Instructions (Signed)
Medication Instructions:  START ASPIRIN 81MG  DAILY  START ISOSORBIDE 15MG (1/2 TABLET) DAILY x3 DAYS; THEN 30MG  (WHOLE TABLET) DAILY *If you need a refill on your cardiac medications before your next appointment, please call your pharmacy*  Lab Work: BMET [place] If you have labs (blood work) drawn today and your tests are completely normal, you will receive your results only by:  [place] (if you have MyChart) OR A paper copy in the mail.  If you have any lab test that is abnormal or we need to change your treatment, we will call you to review the results.  Testing/Procedures: Echocardiogram - Your physician has requested that you have an echocardiogram. Echocardiography is a painless test that uses sound waves to create images of your heart. It provides your doctor with information about the size and shape of your heart and how well your heart's chambers and valves are working. This procedure takes approximately one hour. There are no restrictions for this procedure. This will be performed at our [facility] location - [address], [address].  YOU ARE SCHEDULED FOR Thursday April 1st, 2021 AT 8AM.   Your physician has requested that you have an CT-A   Follow-Up: Your next appointment:   AFTER CT  In Person with Quay Burow, MD.  At [facility], you and your health needs are our priority.  As part of our continuing mission to provide you with exceptional heart care, we have created designated Provider Care Teams.  These Care Teams include your primary Cardiologist (physician) and Advanced Practice Providers (APPs -  Physician Assistants and Nurse Practitioners) who all work together to provide you with the care you need, when you need it.  We recommend signing up for the patient portal called "MyChart".  Sign up information is provided on this After Visit Summary.  MyChart is used to connect with patients for Virtual Visits (Telemedicine).  Patients  are able to view lab/test results, encounter notes, upcoming appointments, etc.  Non-urgent messages can be sent to your provider as well.   To learn more about what you can do with MyChart, go to [email or web address].

## 2020-01-18 ENCOUNTER — Encounter: Payer: Self-pay | Admitting: Cardiology

## 2020-01-18 NOTE — Telephone Encounter (Signed)
Spoke to patient she stated she saw Kerin Ransom PA yesterday.Stated she is scheduled to have a echo tomorrow 01/19/20.

## 2020-01-19 ENCOUNTER — Other Ambulatory Visit: Payer: Self-pay

## 2020-01-19 ENCOUNTER — Ambulatory Visit (HOSPITAL_COMMUNITY): Payer: 59 | Attending: Internal Medicine

## 2020-01-19 ENCOUNTER — Telehealth: Payer: Self-pay

## 2020-01-19 DIAGNOSIS — R079 Chest pain, unspecified: Secondary | ICD-10-CM | POA: Insufficient documentation

## 2020-01-19 DIAGNOSIS — F439 Reaction to severe stress, unspecified: Secondary | ICD-10-CM | POA: Diagnosis not present

## 2020-01-19 NOTE — Telephone Encounter (Signed)
Spoke with patient who saw [place] PA on 3/30. She was started on imdur 15mg  x3 days and was supposed to titrate up to 30mg . She reports side effects of the medication: throbbing in legs and arms; arms itching; headache; neck hurt. The symptoms were worse during the night, improving today. She will hold medication until additional advice is received from [place]

## 2020-01-19 NOTE — Telephone Encounter (Signed)
Patient called w/PA instructions/advice. She voiced understanding. Med list updated

## 2020-01-19 NOTE — Telephone Encounter (Signed)
Jenna I reviewed this patient's echo.  Her heart function is normal- no evidence of "stress induced heart attack".  OK to stop Imdur.  Keep plans for coronary CTA and f/u with Dr Gwenlyn Found.  Thanks  Kerin Ransom PA-C 01/19/2020 1:11 PM

## 2020-01-19 NOTE — Telephone Encounter (Signed)
NEW MESSAGE    Pt c/o medication issue:  1. Name of Medication:  isosorbide mononitrate (IMDUR) 30 MG 24 hr tablet Take 1 tablet (30 mg total) by mouth daily. 1/2 TAB x3D THEN 30MG  QD     2. How are you currently taking this medication (dosage and times per day)? 1/2 TAB FOR 2 DAYS  3. Are you having a reaction (difficulty breathing--STAT)? YES   4. What is your medication issue? LOW ARMS ARE ITCHING , SEVERE ACHING IN ARMS, HANDS AND LEGS

## 2020-01-20 ENCOUNTER — Other Ambulatory Visit: Payer: Self-pay

## 2020-01-20 ENCOUNTER — Telehealth: Payer: Self-pay | Admitting: Cardiovascular Disease

## 2020-01-20 MED ORDER — METOPROLOL TARTRATE 100 MG PO TABS: 100.0000 mg | ORAL_TABLET | Freq: Once | ORAL | 0 refills | Status: DC

## 2020-01-20 NOTE — Telephone Encounter (Signed)
Left message for patient to call and schedule MRA Abd / Pelvis ordered by Dr. Gwenlyn Found

## 2020-01-20 NOTE — Telephone Encounter (Signed)
   [facility] [address], [place]  [address] Phone:  [phone]   Fax:  [place] [address]    Your cardiac CT will be scheduled:   [facility] [address] [place], [place] [address] [phone]  Please arrive at the [facility] main entrance of [facility] 30 minutes prior to test start time(9:30 am). Proceed to the [facility] Radiology Department (first floor) to check-in and test prep.  On the Night Before the Test: . Be sure to Drink plenty of water. . Do not consume any caffeinated/decaffeinated beverages or chocolate 12 hours prior to your test. . Do not take any antihistamines 12 hours prior to your test. . If you take Metformin do not take 24 hours prior to test.  On the Day of the Test: . Drink plenty of water. Do not drink any water within one hour of the test. . Do not eat any food 4 hours prior to the test. . You may take your regular medications prior to the test.  . Take metoprolol (Lopressor) two hours prior to test. . HOLD Furosemide/Hydrochlorothiazide morning of the test. . FEMALES- please wear underwire-free bra if available                 -If HR is less than 55 BPM- No Lopressor                -IF HR is greater than 55 BPM and patient is less than or equal to 63 yrs old Lopressor 100mg  x1.                -If HR is greater than 55 BPM and patient is greater than 30 yrs old Lopressor 50 mg x1.        After the Test: . Drink plenty of water. . After receiving IV contrast, you may experience a mild flushed feeling. This is normal. . On occasion, you may experience a mild rash up to 24 hours after the test. This is not dangerous. If this occurs, you can take Benadryl 25 mg and increase your fluid intake. . If you experience trouble breathing, this can be serious. If it is severe call 911 IMMEDIATELY. If it is mild, please call our  office. . If you take any of these medications: Glipizide/Metformin, Avandament, Glucavance, please do not take 48 hours after completing test unless otherwise instructed.   Once we have confirmed authorization from your insurance company, we will call you to set up a date and time for your test.   For non-scheduling related questions, please contact the cardiac imaging nurse navigator should you have any questions/concerns: Marchia Bond, RN Navigator Cardiac Imaging [facility] [phone] office  For scheduling needs, including cancellations and rescheduling, please call [phone].   LETTER MAILED

## 2020-01-24 ENCOUNTER — Telehealth: Payer: Self-pay | Admitting: Cardiovascular Disease

## 2020-01-24 DIAGNOSIS — R072 Precordial pain: Secondary | ICD-10-CM

## 2020-01-24 NOTE — Telephone Encounter (Signed)
Left message for patient. Would advise patient to get blood drawn tomorrow if possible and not reschedule procedure.

## 2020-01-24 NOTE — Telephone Encounter (Signed)
Called patient to schedule MRA abd/pelvis ordered by Dr. Gwenlyn Found.  Patient is scheduled for Cardiac CT Friday 01/27/20----patient went for lab work today and was so dehydrated the tech could not get any blood.  Patient wants to know if we should reschedule the CT.

## 2020-01-25 NOTE — Telephone Encounter (Signed)
Patient returning call. She states she would rather have a stress test instead of the CT.

## 2020-01-26 NOTE — Telephone Encounter (Signed)
OK to do a lexiscan MV instead of a CTA

## 2020-01-26 NOTE — Telephone Encounter (Signed)
Left detailed message per DPR. Lexiscan myoview order placed. Message sent to scheduling to assist with making appointment for patient. CT cancelled.             Your physician has requested that you have a lexiscan myoview. For further information please visit [email or web address]. Please follow instruction sheet, as given. This will take place at [place], [address]  How to prepare for your Myocardial Perfusion Test:  Do not eat or drink 3 hours prior to your test, except you may have water.  Do not consume products containing caffeine (regular or decaffeinated) 12 hours prior to your test. (ex: coffee, chocolate, sodas, tea).  Do bring a list of your current medications with you.  If not listed below, you may take your medications as normal.  Do wear comfortable clothes (no dresses or overalls) and walking shoes, tennis shoes preferred (No heels or open toe shoes are allowed).  Do NOT wear cologne, perfume, aftershave, or lotions (deodorant is allowed).  The test will take approximately 3 to 4 hours to complete  If these instructions are not followed, your test will have to be rescheduled.

## 2020-01-27 ENCOUNTER — Ambulatory Visit (HOSPITAL_COMMUNITY): Payer: 59

## 2020-01-27 NOTE — Telephone Encounter (Signed)
Follow up  Pt returning call, she said if she can get a regular stress test on a treadmill with EKG instead of a lexiscan. She said she is not ready with anything invasive  Please advise

## 2020-01-27 NOTE — Telephone Encounter (Signed)
Spoke with patient to schedule Jade [name] ordered by [facility]-  Patient wants to research this test and will [name] back to schedule

## 2020-01-28 NOTE — Telephone Encounter (Signed)
Routine GXT is fine

## 2020-01-31 ENCOUNTER — Other Ambulatory Visit: Payer: Self-pay

## 2020-01-31 DIAGNOSIS — R079 Chest pain, unspecified: Secondary | ICD-10-CM

## 2020-01-31 DIAGNOSIS — R072 Precordial pain: Secondary | ICD-10-CM

## 2020-01-31 NOTE — Telephone Encounter (Signed)
Patient is aware treadmill to be scheduled.Order placed.

## 2020-02-02 ENCOUNTER — Telehealth: Payer: Self-pay | Admitting: *Deleted

## 2020-02-02 NOTE — Telephone Encounter (Signed)
Tried calling to schedule GXT ordered by Dr. Roxan Hockey mail is full

## 2020-02-22 ENCOUNTER — Telehealth: Payer: Self-pay | Admitting: Cardiovascular Disease

## 2020-02-22 NOTE — Telephone Encounter (Signed)
Spoke with patient to reschedule MR abd/pelvis --pt states she will call me back to schedule

## 2020-02-23 ENCOUNTER — Encounter: Payer: Self-pay | Admitting: Cardiovascular Disease

## 2020-02-24 ENCOUNTER — Telehealth (HOSPITAL_COMMUNITY): Payer: Self-pay

## 2020-02-24 NOTE — Telephone Encounter (Signed)
Encounter complete. 

## 2020-02-25 ENCOUNTER — Other Ambulatory Visit (HOSPITAL_COMMUNITY)
Admission: RE | Admit: 2020-02-25 | Discharge: 2020-02-25 | Disposition: A | Discharge status: Home / Self Care | Payer: 59 | Source: Ambulatory Visit | Attending: Cardiovascular Disease | Admitting: Cardiovascular Disease

## 2020-02-25 DIAGNOSIS — Z01812 Encounter for preprocedural laboratory examination: Secondary | ICD-10-CM | POA: Diagnosis not present

## 2020-02-25 DIAGNOSIS — Z20822 Contact with and (suspected) exposure to covid-19: Secondary | ICD-10-CM | POA: Insufficient documentation

## 2020-02-25 LAB — SARS CORONAVIRUS 2 (TAT 6-24 HRS): SARS Coronavirus 2: NEGATIVE

## 2020-02-29 ENCOUNTER — Other Ambulatory Visit: Payer: Self-pay

## 2020-02-29 ENCOUNTER — Ambulatory Visit (HOSPITAL_COMMUNITY)
Admission: RE | Admit: 2020-02-29 | Discharge: 2020-02-29 | Disposition: A | Discharge status: Home / Self Care | Payer: 59 | Source: Ambulatory Visit | Attending: Cardiovascular Disease | Admitting: Cardiovascular Disease

## 2020-02-29 DIAGNOSIS — R079 Chest pain, unspecified: Secondary | ICD-10-CM | POA: Diagnosis not present

## 2020-02-29 DIAGNOSIS — R072 Precordial pain: Secondary | ICD-10-CM | POA: Insufficient documentation

## 2020-02-29 LAB — EXERCISE TOLERANCE TEST
Estimated workload: 10.1 METS
Exercise duration (min): 9 min
Exercise duration (sec): 0 s
MPHR: 156 {beats}/min
Peak HR: 141 {beats}/min
Percent HR: 90 %
Rest HR: 61 {beats}/min

## 2020-03-09 ENCOUNTER — Telehealth: Payer: Self-pay

## 2020-03-09 DIAGNOSIS — R072 Precordial pain: Secondary | ICD-10-CM

## 2020-03-09 NOTE — Telephone Encounter (Signed)
Spoke to patient gxt results given.Dr.Berry advised to schedule lexiscan myoview.Scheduler will call back to schedule.Order placed.

## 2020-03-15 ENCOUNTER — Ambulatory Visit: Payer: 59 | Admitting: Cardiovascular Disease

## 2020-03-15 ENCOUNTER — Other Ambulatory Visit: Payer: Self-pay

## 2020-03-15 ENCOUNTER — Encounter: Payer: Self-pay | Admitting: Cardiovascular Disease

## 2020-03-15 VITALS — BP 130/84 | HR 78 | Ht 68.5 in | Wt 135.8 lb

## 2020-03-15 DIAGNOSIS — Z8249 Family history of ischemic heart disease and other diseases of the circulatory system: Secondary | ICD-10-CM | POA: Diagnosis not present

## 2020-03-15 DIAGNOSIS — Z01812 Encounter for preprocedural laboratory examination: Secondary | ICD-10-CM

## 2020-03-15 DIAGNOSIS — R072 Precordial pain: Secondary | ICD-10-CM | POA: Diagnosis not present

## 2020-03-15 MED ORDER — METOPROLOL TARTRATE 100 MG PO TABS: ORAL_TABLET | ORAL | 0 refills | Status: DC

## 2020-03-15 NOTE — Assessment & Plan Note (Signed)
Jade [name] returns for follow-up.  She continues to have chest pain.  She did have a normal 2D echo 01/19/2020 and a stress test that showed upsloping ST segment depression.  Given her strong family history I am to get a coronary CTA to further evaluate.

## 2020-03-15 NOTE — Progress Notes (Signed)
03/15/2020 Jade [name]   1955-11-19  [id number]  Primary Physician Patient, No Pcp Per Primary Cardiologist: Lorretta Harp MD Renae Gloss  HPI:  Jade [name] is a 64 y.o.  thin and fit appearing married Caucasian female mother of 2 children, grandmother 3 grandchildren referred by her PCP/OB/GYN Dr. Dian Queen for cardiovascular evaluation because of atypical chest pain.  I last saw her in the office 08/09/2019. Her only risk factor includes family history with a brother who has had stents and father had a myocardial infarction in his 4s.  She is never had a heart attack or stroke.  She has no other cardiac risk factors.  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  She is fairly active and is raising her grandchild.  She did have chest pain back in 2011 and saw Dr. Jeffie Pollock on 09/10/2010 where the coronary CTA that showed a coronary calcium score of 0 with no evidence of CAD.  Pain at that time resolved.  She is had recurrent chest pain over last several months occurring on a daily basis described as a pressure across her chest lasting for hours at a time.  She had a 2D echo performed 01/19/2020 that was essentially normal and a stress test performed 02/29/2020 that showed upsloping ST segment depression.  She continues to have chest pain that substernal rating to her back.  Unfortunately her 40 year old husband died 6 days after receiving the Covid vaccine in his sleep presumably of a pulmonary embolus in her 41 year old daughter died the same day of a drug overdose.  She is currently raising her granddaughter.   Current Meds  Medication Sig  . estrogen, conjugated,-medroxyprogesterone (PREMPRO) 0.625-2.5 MG per tablet Take 1 tablet by mouth every evening.  . Multiple Vitamin (MULTIVITAMIN) tablet Take 1 tablet by mouth daily.  . nitroGLYCERIN (NITROSTAT) 0.4 MG SL tablet Place 0.4 mg under the tongue every 5 (five) minutes as needed for chest pain.      Allergies  Allergen Reactions  . Phenergan [Promethazine] Other (See Comments)    Patient not sure of reaction    Social History   Socioeconomic History  . Marital status: Married    Spouse name: Not on file  . Number of children: 2  . Years of education: Not on file  . Highest education level: Not on file  Occupational History  . 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 Smoking status: Never Smoker  . Smokeless tobacco: Never Used  Substance and Sexual Activity  . Alcohol use: Yes    Alcohol/week: 0.0 standard drinks    Comment: rarely 1-2 times per year  . Drug use: No  . Sexual activity: Not on file  Other Topics Concern  . 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 Difficulty of Paying Living Expenses:   Food Insecurity:   . Worried About Charity fundraiser in the Last Year:   . Arboriculturist in the Last Year:   Transportation Needs:   . Lack  of Transportation (Medical):   Marland Kitchen Lack of Transportation (Non-Medical):   Physical Activity:   . Days of Exercise per Week:   . Minutes of Exercise per Session:   Stress:   . Feeling of Stress :   Social Connections:   . Frequency of Communication with Friends and Family:   . Frequency of Social Gatherings with Friends and Family:   . Attends Religious Services:   . Active Member of Clubs or Organizations:   . Attends Archivist Meetings:   Marland Kitchen Marital Status:   Intimate Partner Violence:   . Fear of Current or Ex-Partner:   . Emotionally Abused:   Marland Kitchen Physically Abused:   . Sexually Abused:      Review of Systems: General: negative for chills,  fever, night sweats or weight changes.  Cardiovascular: negative for chest pain, dyspnea on exertion, edema, orthopnea, palpitations, paroxysmal nocturnal dyspnea or shortness of breath Dermatological: negative for rash Respiratory: negative for cough or wheezing Urologic: negative for hematuria Abdominal: negative for nausea, vomiting, diarrhea, bright red blood per rectum, melena, or hematemesis Neurologic: negative for visual changes, syncope, or dizziness All other systems reviewed and are otherwise negative except as noted above.    Blood pressure 130/84, pulse 78, height 5' 8.5" (1.74 m), weight 135 lb 12.8 oz (61.6 kg), SpO2 98 %.  General appearance: alert and no distress Neck: no adenopathy, no carotid bruit, no JVD, supple, symmetrical, trachea midline and thyroid not enlarged, symmetric, no tenderness/mass/nodules Lungs: clear to auscultation bilaterally Heart: regular rate and rhythm, S1, S2 normal, no murmur, click, rub or gallop Extremities: extremities normal, atraumatic, no cyanosis or edema Pulses: 2+ and symmetric Skin: Skin color, texture, turgor normal. No rashes or lesions Neurologic: Alert and oriented X 3, normal strength and tone. Normal symmetric reflexes. Normal coordination and gait  EKG not performed today  ASSESSMENT AND PLAN:   Chest pain with moderate risk for cardiac etiology Ms. Decrescenzo returns for follow-up.  She continues to have chest pain.  She did have a normal 2D echo 01/19/2020 and a stress test that showed upsloping ST segment depression.  Given her strong family history I am to get a coronary CTA to further evaluate.      Lorretta Harp MD FACP,FACC,FAHA, [facility] 03/15/2020 4:35 PM

## 2020-03-15 NOTE — Patient Instructions (Addendum)
Medication Instructions:  Your physician recommends that you continue on your current medications as directed. Please refer to the Current Medication list given to you today.  *If you need a refill on your cardiac medications before your next appointment, please call your pharmacy*   Lab Work: BMET to be completed 1 week prior to CT test  If you have labs (blood work) drawn today and your tests are completely normal, you will receive your results only by: Marland Kitchen MyChart Message (if you have MyChart) OR . A paper copy in the mail If you have any lab test that is abnormal or we need to change your treatment, we will call you to review the results.   Testing/Procedures: Coronary CT to be scheduled at [facility] Instructions noted below   Follow-Up: At [facility], you and your health needs are our priority.  As part of our continuing mission to provide you with exceptional heart care, we have created designated Provider Care Teams.  These Care Teams include your primary Cardiologist (physician) and Advanced Practice Providers (APPs -  Physician Assistants and Nurse Practitioners) who all work together to provide you with the care you need, when you need it.  We recommend signing up for the patient portal called "MyChart".  Sign up information is provided on this After Visit Summary.  MyChart is used to connect with patients for Virtual Visits (Telemedicine).  Patients are able to view lab/test results, encounter notes, upcoming appointments, etc.  Non-urgent messages can be sent to your provider as well.   To learn more about what you can do with MyChart, go to [email or web address].    Your next appointment:   12 month(s)  The format for your next appointment:   In Person  Provider:   You may see Dr. Gwenlyn Found or one of the following Advanced Practice Providers on your designated Care Team:    Kerin Ransom, PA-C  [place], [place]  Coletta Memos, [place]    Other  Instructions  Your cardiac CT will be scheduled at one of the below locations:   [facility] [address] [place], [place] [address] [phone]  [place] [address] [place], [place] [address] [phone]  If scheduled at [facility], please arrive at the [facility] main entrance of [facility] 30 minutes prior to test start time. Proceed to the [facility] Radiology Department (first floor) to check-in and test prep.  If scheduled at [facility], please arrive 15 mins early for check-in and test prep.  Please follow these instructions carefully (unless otherwise directed):    On the Night Before the Test: . Be sure to Drink plenty of water. . Do not consume any caffeinated/decaffeinated beverages or chocolate 12 hours prior to your test. . Do not take any antihistamines 12 hours prior to your test.  On the Day of the Test: . Drink plenty of water. Do not drink any water within one hour of the test. . Do not eat any food 4 hours prior to the test. . You may take your regular medications prior to the test.  . Take metoprolol (Lopressor) two hours prior to test. . FEMALES- please wear underwire-free bra if available  After the Test: . Drink plenty of water. . After receiving IV contrast, you may experience a mild flushed feeling. This is normal. . On occasion, you may experience a mild rash up to 24 hours after the  test. This is not dangerous. If this occurs, you can take Benadryl 25 mg and increase your fluid intake. . If you experience trouble breathing, this can be serious. If it is severe call 911 IMMEDIATELY. If it is mild, please call our office. . If you take any of these medications: Glipizide/Metformin, Avandament, Glucavance, please do not take 48 hours after completing test unless otherwise instructed.   Once we have confirmed authorization from  your insurance company, we will call you to set up a date and time for your test.   For non-scheduling related questions, please contact the cardiac imaging nurse navigator should you have any questions/concerns: Marchia Bond, Cardiac Imaging Nurse Navigator [place] Saver, Interim Cardiac Imaging Nurse [place] [facility] Direct Office Dial: [phone]   For scheduling needs, including cancellations and rescheduling, please call [phone].

## 2020-03-20 ENCOUNTER — Inpatient Hospital Stay (HOSPITAL_COMMUNITY): Admission: RE | Admit: 2020-03-20 | Payer: 59 | Source: Ambulatory Visit

## 2020-03-21 ENCOUNTER — Telehealth (HOSPITAL_COMMUNITY): Payer: Self-pay | Admitting: Cardiovascular Disease

## 2020-03-21 NOTE — Telephone Encounter (Signed)
Patient called and cancelled with Avelina Laine on 03/14/2020 her 2 day Lexiscan and told her she did not wish to have test per documentation on cancellation reason. Order will be removed from the [place] [facility] and if patient decides to have we will reinstate order or create new one.

## 2020-03-21 NOTE — Telephone Encounter (Signed)
OV 5/27 with Dr. Gwenlyn Found.    Coronary CTA ordered, lexi cancelled.

## 2020-03-22 ENCOUNTER — Encounter (HOSPITAL_COMMUNITY): Payer: 59

## 2020-05-07 ENCOUNTER — Other Ambulatory Visit (HOSPITAL_COMMUNITY): Payer: Self-pay | Admitting: *Deleted

## 2020-05-07 MED ORDER — METOPROLOL TARTRATE 100 MG PO TABS: ORAL_TABLET | ORAL | 0 refills | Status: AC

## 2020-05-09 ENCOUNTER — Ambulatory Visit (HOSPITAL_COMMUNITY): Admission: RE | Admit: 2020-05-09 | Payer: 59 | Source: Ambulatory Visit

## 2020-05-09 LAB — BASIC METABOLIC PANEL
BUN/Creatinine Ratio: 10 — ABNORMAL LOW (ref 12–28)
BUN: 7 mg/dL — ABNORMAL LOW (ref 8–27)
CO2: 24 mmol/L (ref 20–29)
Calcium: 9.1 mg/dL (ref 8.7–10.3)
Chloride: 101 mmol/L (ref 96–106)
Creatinine, Ser: 0.71 mg/dL (ref 0.57–1.00)
GFR calc Af Amer: 104 mL/min/{1.73_m2} (ref >59)
GFR calc non Af Amer: 90 mL/min/{1.73_m2} (ref >59)
Glucose: 88 mg/dL (ref 65–99)
Potassium: 4.3 mmol/L (ref 3.5–5.2)
Sodium: 138 mmol/L (ref 134–144)

## 2020-05-16 ENCOUNTER — Telehealth (HOSPITAL_COMMUNITY): Payer: Self-pay | Admitting: *Deleted

## 2020-05-16 NOTE — Telephone Encounter (Signed)

## 2020-05-21 ENCOUNTER — Ambulatory Visit (HOSPITAL_COMMUNITY): Admission: RE | Admit: 2020-05-21 | Payer: 59 | Source: Ambulatory Visit

## 2020-05-22 ENCOUNTER — Other Ambulatory Visit: Payer: Self-pay

## 2020-05-22 ENCOUNTER — Other Ambulatory Visit: Payer: Self-pay | Admitting: Obstetrics and Gynecology

## 2020-05-22 ENCOUNTER — Ambulatory Visit
Admission: RE | Admit: 2020-05-22 | Discharge: 2020-05-22 | Disposition: A | Discharge status: Home / Self Care | Payer: 59 | Source: Ambulatory Visit | Attending: Obstetrics and Gynecology | Admitting: Obstetrics and Gynecology

## 2020-05-22 DIAGNOSIS — K5792 Diverticulitis of intestine, part unspecified, without perforation or abscess without bleeding: Secondary | ICD-10-CM

## 2021-08-11 IMAGING — MR MRI BREAST BIL WO CONTRAST
4 of 10 series · 24 of 48 positions shown · non-contrast
Comparison: None

HISTORY: Ruptured implants.
TECHNIQUE: MRI bilateral breasts without contrast.

[Series 2: ir_axial_bil · axial · 3.0mm · 0.66mm/px · z∈[-113,+62]mm · 8 of 54 slices shown]
[im 1/54]
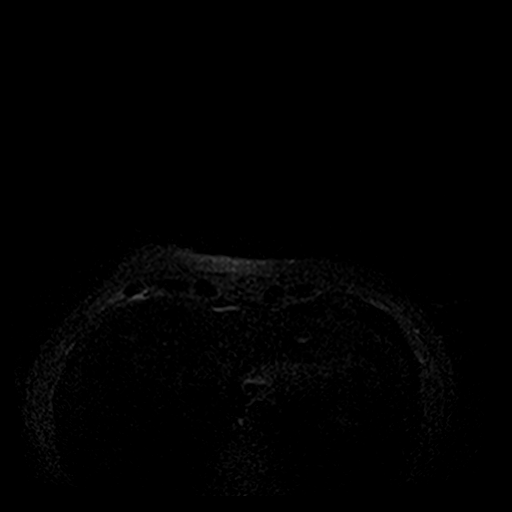
[im 8/54]
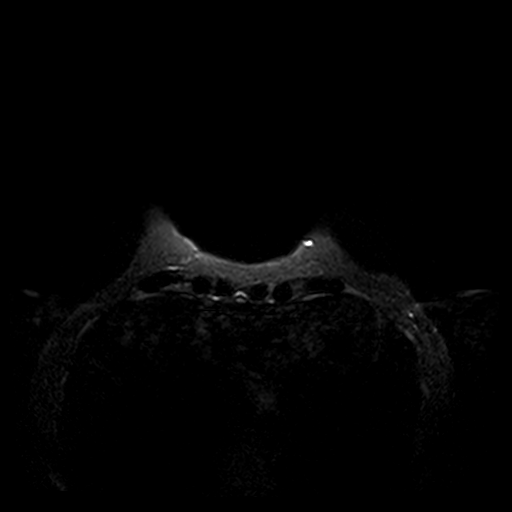
[im 16/54]
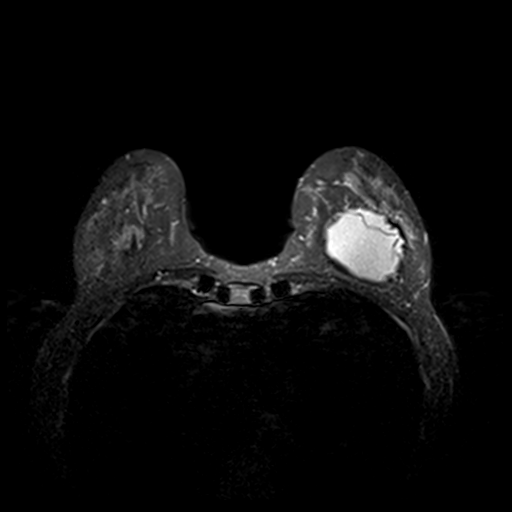
[im 23/54]
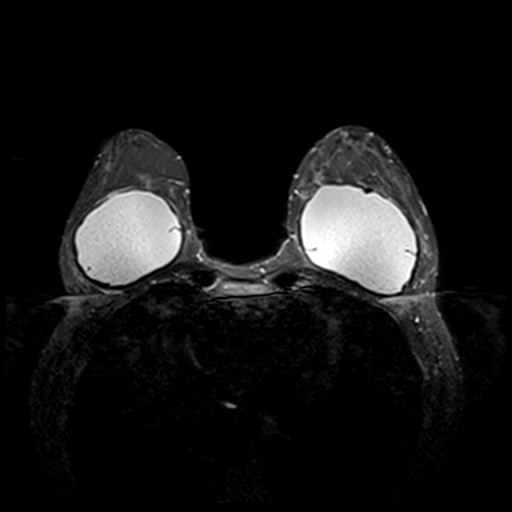
[im 31/54]
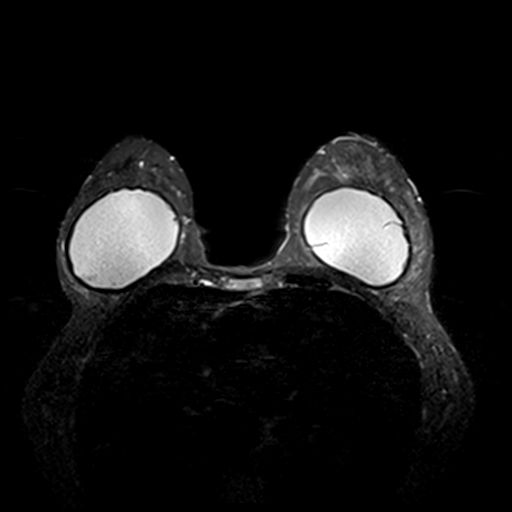
[im 38/54]
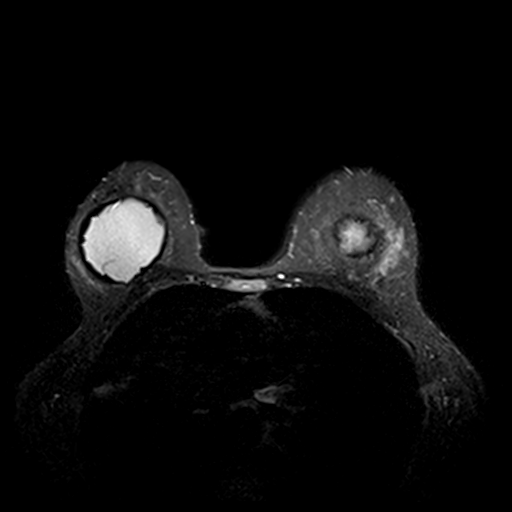
[im 46/54]
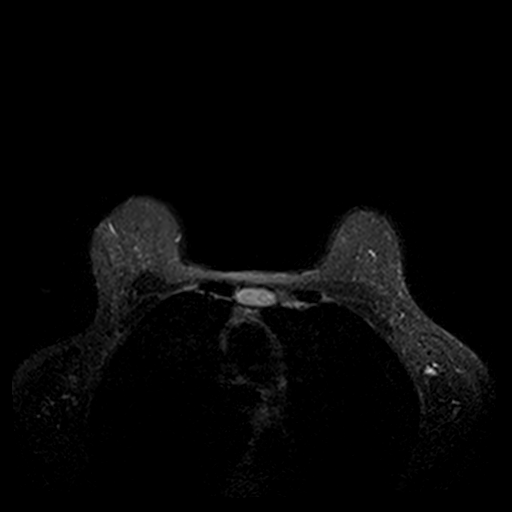
[im 54/54]
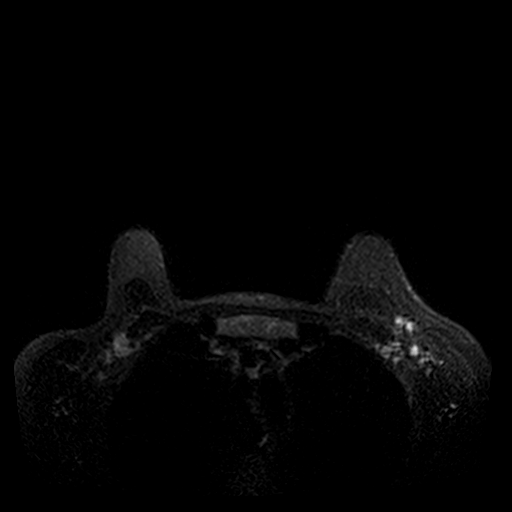

[Series 3: ir_axial_ws · axial · 3.0mm · 0.66mm/px · z∈[-113,+62]mm · 8 of 54 slices shown]
[im 1/54]
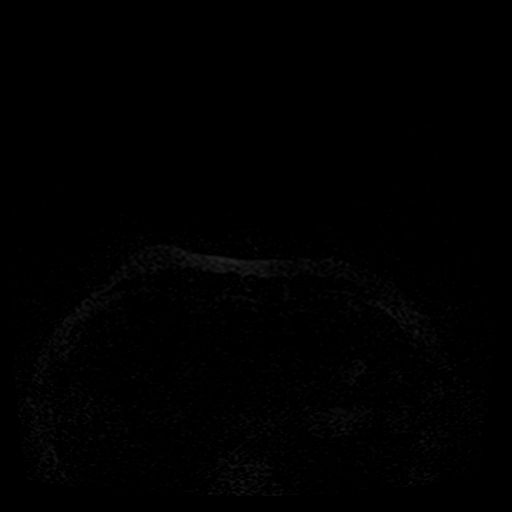
[im 8/54]
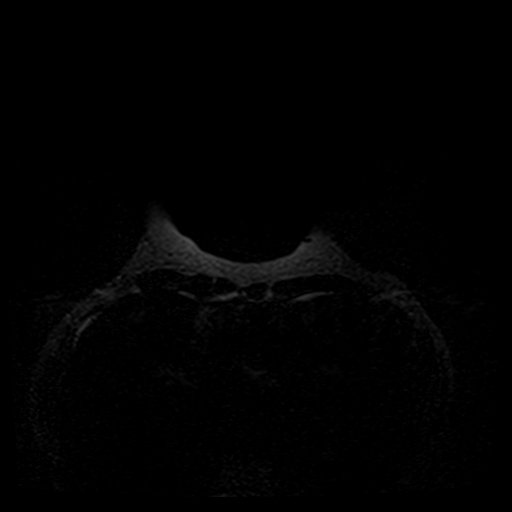
[im 16/54]
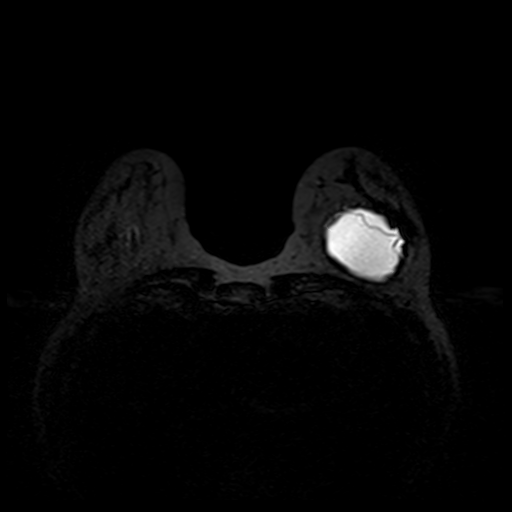
[im 23/54]
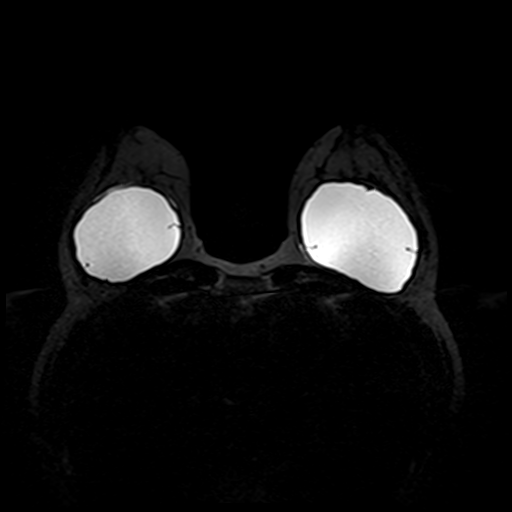
[im 31/54]
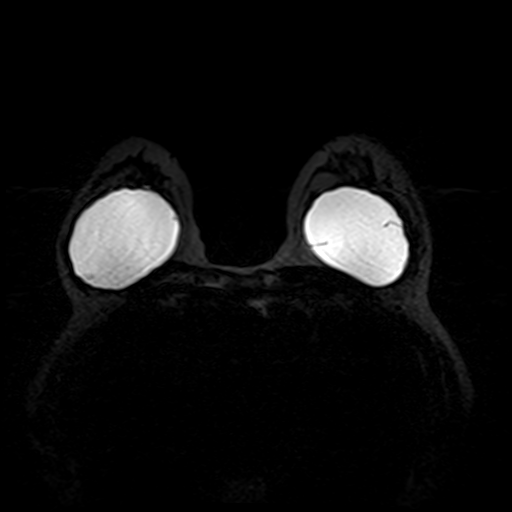
[im 38/54]
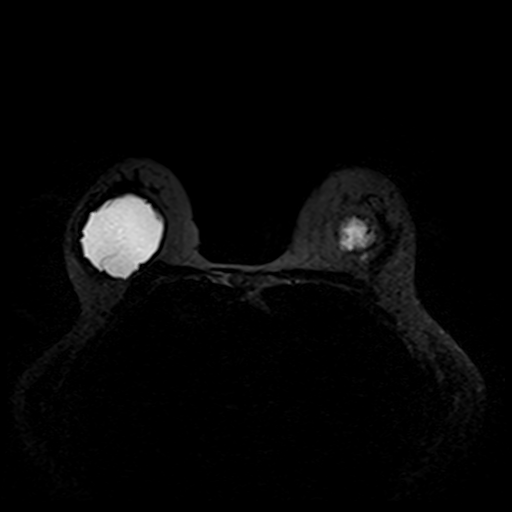
[im 46/54]
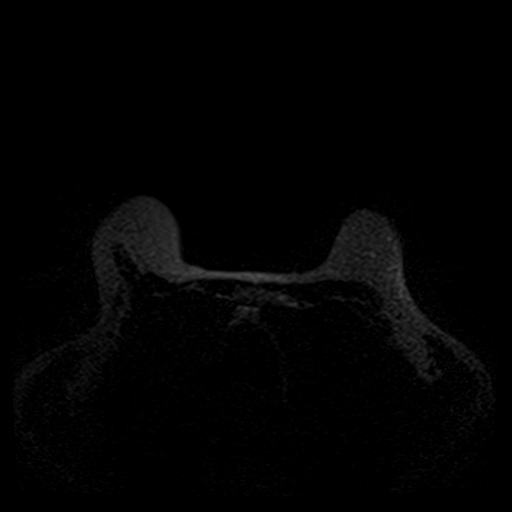
[im 54/54]
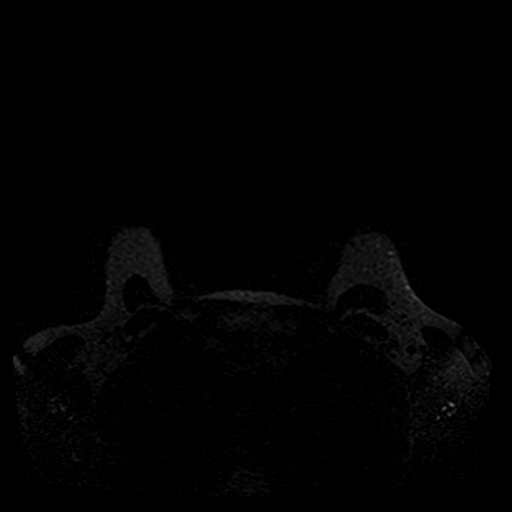

[Series 4: ir_sag_ws_left · sagittal · 4.0mm · 0.75mm/px · 4 of 26 slices shown]
[im 1/26]
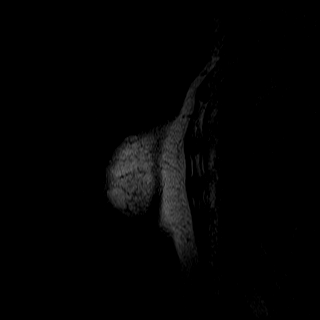
[im 9/26]
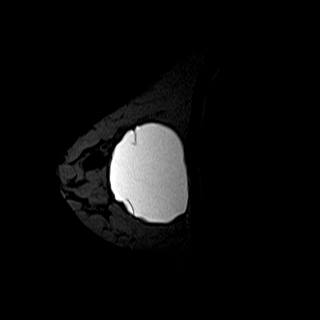
[im 17/26]
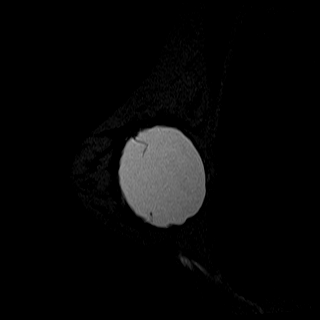
[im 26/26]
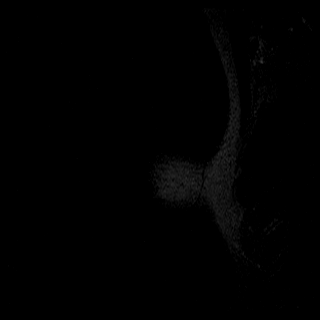

[Series 6: ir_sag_ws_right · sagittal · 4.0mm · 0.75mm/px · 4 of 26 slices shown]
[im 1/26]
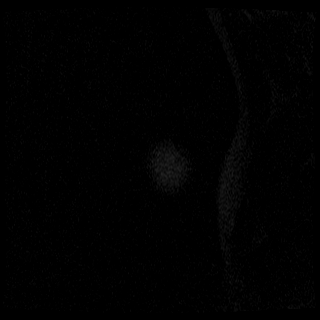
[im 9/26]
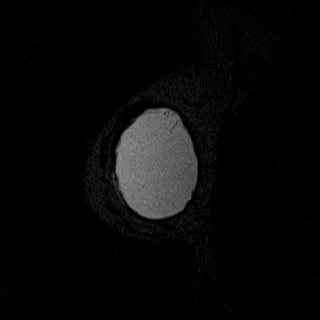
[im 17/26]
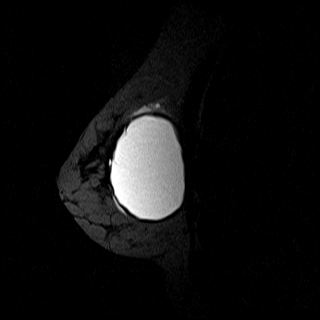
[im 26/26]
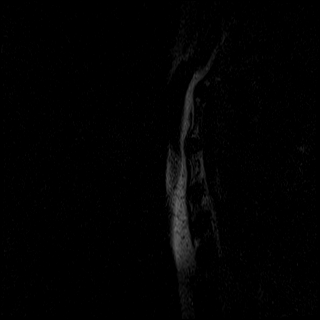

[24 of 48 positions shown; findings below may reference images not displayed]

FINDINGS: Bilateral subglandular silicone breast implants.

Bilateral intracapsular implant rupture with silicone evident between the implant wall and capsule bilaterally. Additionally, there is suspected extracapsular spread of silicone superomedial aspect right capsule.

Prominent radial folds are also noted bilaterally.

No convincing evidence of silicone within axillary lymph nodes or internal mammary chain lymph nodes.

There are prominent axillary lymph nodes bilaterally. Recommend further evaluation with ultrasound by dedicated mammography team.
IMPRESSION: 1. Rupture of the subglandular silicone implants bilaterally. Intracapsular rupture on the left. Mixed intracapsular and extracapsular rupture on the right.

2. No silicone identified within the axillary lymph nodes or internal mammary chain lymph nodes.

3. Prominent axillary lymph nodes bilaterally. Recommend further evaluation with ultrasound by dedicated mammography team.

## 2024-01-20 ENCOUNTER — Other Ambulatory Visit: Payer: Self-pay | Admitting: Obstetrics and Gynecology

## 2024-01-20 DIAGNOSIS — T8549XA Other mechanical complication of breast prosthesis and implant, initial encounter: Secondary | ICD-10-CM

## 2024-02-28 ENCOUNTER — Ambulatory Visit
Admission: RE | Admit: 2024-02-28 | Discharge: 2024-02-28 | Disposition: A | Discharge status: Home / Self Care | Source: Ambulatory Visit | Attending: Obstetrics and Gynecology | Admitting: Obstetrics and Gynecology

## 2024-02-28 DIAGNOSIS — T8549XA Other mechanical complication of breast prosthesis and implant, initial encounter: Secondary | ICD-10-CM

## 2024-06-29 ENCOUNTER — Other Ambulatory Visit: Payer: Self-pay | Admitting: Internal Medicine

## 2024-06-29 ENCOUNTER — Ambulatory Visit
Admission: RE | Admit: 2024-06-29 | Discharge: 2024-06-29 | Disposition: A | Discharge status: Home / Self Care | Source: Ambulatory Visit | Attending: Internal Medicine | Admitting: Internal Medicine

## 2024-06-29 ENCOUNTER — Encounter: Payer: Self-pay | Admitting: Internal Medicine

## 2024-06-29 DIAGNOSIS — R0789 Other chest pain: Secondary | ICD-10-CM
